# Patient Record
Sex: Female | Born: 1975 | Race: Black or African American | Hispanic: No | Marital: Single | Smoking: Former smoker
Health system: Southern US, Community
[De-identification: ages and names within clinical notes are randomized; demographics above are authoritative.]

## PROBLEM LIST (undated history)

## (undated) DIAGNOSIS — Z789 Other specified health status: Secondary | ICD-10-CM

---

## 2001-03-20 HISTORY — PX: TUBAL LIGATION: SHX77

## 2003-11-04 ENCOUNTER — Emergency Department (HOSPITAL_COMMUNITY): Admission: EM | Admit: 2003-11-04 | Discharge: 2003-11-04 | Payer: Self-pay | Admitting: Family Medicine

## 2004-01-26 ENCOUNTER — Emergency Department (HOSPITAL_COMMUNITY): Admission: EM | Admit: 2004-01-26 | Discharge: 2004-01-26 | Payer: Self-pay | Admitting: Family Medicine

## 2004-02-03 ENCOUNTER — Emergency Department (HOSPITAL_COMMUNITY): Admission: EM | Admit: 2004-02-03 | Discharge: 2004-02-03 | Payer: Self-pay | Admitting: Family Medicine

## 2004-03-25 ENCOUNTER — Emergency Department (HOSPITAL_COMMUNITY): Admission: EM | Admit: 2004-03-25 | Discharge: 2004-03-25 | Payer: Self-pay | Admitting: Family Medicine

## 2004-04-11 ENCOUNTER — Emergency Department (HOSPITAL_COMMUNITY): Admission: AD | Admit: 2004-04-11 | Discharge: 2004-04-11 | Payer: Self-pay | Admitting: Family Medicine

## 2004-07-11 ENCOUNTER — Emergency Department (HOSPITAL_COMMUNITY): Admission: EM | Admit: 2004-07-11 | Discharge: 2004-07-11 | Payer: Self-pay | Admitting: Family Medicine

## 2009-02-02 ENCOUNTER — Emergency Department (HOSPITAL_COMMUNITY): Admission: EM | Admit: 2009-02-02 | Discharge: 2009-02-02 | Payer: Self-pay | Admitting: Emergency Medicine

## 2009-03-12 ENCOUNTER — Emergency Department (HOSPITAL_COMMUNITY): Admission: EM | Admit: 2009-03-12 | Discharge: 2009-03-12 | Payer: Self-pay | Admitting: Family Medicine

## 2009-04-20 ENCOUNTER — Emergency Department (HOSPITAL_COMMUNITY): Admission: EM | Admit: 2009-04-20 | Discharge: 2009-04-20 | Payer: Self-pay | Admitting: Family Medicine

## 2009-07-02 ENCOUNTER — Inpatient Hospital Stay (HOSPITAL_COMMUNITY): Admission: AD | Admit: 2009-07-02 | Discharge: 2009-07-02 | Payer: Self-pay | Admitting: Obstetrics & Gynecology

## 2010-01-02 ENCOUNTER — Ambulatory Visit: Payer: Self-pay | Admitting: Nurse Practitioner

## 2010-01-02 ENCOUNTER — Inpatient Hospital Stay (HOSPITAL_COMMUNITY)
Admission: AD | Admit: 2010-01-02 | Discharge: 2010-01-02 | Payer: Self-pay | Source: Home / Self Care | Admitting: Obstetrics & Gynecology

## 2010-06-07 LAB — WET PREP, GENITAL
Trich, Wet Prep: NONE SEEN
Yeast Wet Prep HPF POC: NONE SEEN

## 2010-06-07 LAB — URINE MICROSCOPIC-ADD ON

## 2010-06-07 LAB — URINALYSIS, ROUTINE W REFLEX MICROSCOPIC
Bilirubin Urine: NEGATIVE
Glucose, UA: NEGATIVE mg/dL
Ketones, ur: NEGATIVE mg/dL
Nitrite: NEGATIVE
Protein, ur: NEGATIVE mg/dL
Specific Gravity, Urine: >1.03 — ABNORMAL HIGH (ref 1.005–1.030)
Urobilinogen, UA: 0.2 mg/dL (ref 0.0–1.0)
pH: 5.5 (ref 5.0–8.0)

## 2010-06-07 LAB — CBC
HCT: 36 % (ref 36.0–46.0)
Hemoglobin: 12 g/dL (ref 12.0–15.0)
MCHC: 33.3 g/dL (ref 30.0–36.0)
MCV: 82.5 fL (ref 78.0–100.0)
Platelets: 245 10*3/uL (ref 150–400)
RBC: 4.36 MIL/uL (ref 3.87–5.11)
RDW: 14.9 % (ref 11.5–15.5)
WBC: 5.4 10*3/uL (ref 4.0–10.5)

## 2010-06-07 LAB — GC/CHLAMYDIA PROBE AMP, GENITAL
Chlamydia, DNA Probe: NEGATIVE
GC Probe Amp, Genital: NEGATIVE

## 2010-06-07 LAB — POCT PREGNANCY, URINE: Preg Test, Ur: NEGATIVE

## 2010-06-08 LAB — GC/CHLAMYDIA PROBE AMP, GENITAL
Chlamydia, DNA Probe: NEGATIVE
GC Probe Amp, Genital: NEGATIVE

## 2010-06-08 LAB — POCT URINALYSIS DIP (DEVICE)
Bilirubin Urine: NEGATIVE
Glucose, UA: NEGATIVE mg/dL
Hgb urine dipstick: NEGATIVE
Nitrite: NEGATIVE
Protein, ur: 30 mg/dL — AB
Specific Gravity, Urine: 1.02 (ref 1.005–1.030)
Urobilinogen, UA: 2 mg/dL — ABNORMAL HIGH (ref 0.0–1.0)
pH: 6.5 (ref 5.0–8.0)

## 2010-06-08 LAB — WET PREP, GENITAL
Trich, Wet Prep: NONE SEEN
WBC, Wet Prep HPF POC: NONE SEEN
Yeast Wet Prep HPF POC: NONE SEEN

## 2010-06-08 LAB — POCT PREGNANCY, URINE: Preg Test, Ur: NEGATIVE

## 2010-06-22 LAB — DIFFERENTIAL
Basophils Absolute: 0 10*3/uL (ref 0.0–0.1)
Basophils Relative: 1 % (ref 0–1)
Eosinophils Absolute: 0 10*3/uL (ref 0.0–0.7)
Eosinophils Relative: 1 % (ref 0–5)
Lymphocytes Relative: 25 % (ref 12–46)
Lymphs Abs: 1.7 10*3/uL (ref 0.7–4.0)
Monocytes Absolute: 0.5 10*3/uL (ref 0.1–1.0)
Monocytes Relative: 8 % (ref 3–12)
Neutro Abs: 4.3 10*3/uL (ref 1.7–7.7)
Neutrophils Relative %: 65 % (ref 43–77)

## 2010-06-22 LAB — POCT I-STAT, CHEM 8
BUN: 7 mg/dL (ref 6–23)
Calcium, Ion: 1.17 mmol/L (ref 1.12–1.32)
Chloride: 104 mEq/L (ref 96–112)
Creatinine, Ser: 0.8 mg/dL (ref 0.4–1.2)
Glucose, Bld: 81 mg/dL (ref 70–99)
HCT: 45 % (ref 36.0–46.0)
Hemoglobin: 15.3 g/dL — ABNORMAL HIGH (ref 12.0–15.0)
Potassium: 4.1 mEq/L (ref 3.5–5.1)
Sodium: 139 mEq/L (ref 135–145)
TCO2: 25 mmol/L (ref 0–100)

## 2010-06-22 LAB — POCT URINALYSIS DIP (DEVICE)
Bilirubin Urine: NEGATIVE
Glucose, UA: NEGATIVE mg/dL
Hgb urine dipstick: NEGATIVE
Ketones, ur: NEGATIVE mg/dL
Nitrite: NEGATIVE
Protein, ur: NEGATIVE mg/dL
Specific Gravity, Urine: 1.01 (ref 1.005–1.030)
Urobilinogen, UA: 0.2 mg/dL (ref 0.0–1.0)
pH: 7 (ref 5.0–8.0)

## 2010-06-22 LAB — POCT PREGNANCY, URINE: Preg Test, Ur: NEGATIVE

## 2010-06-22 LAB — CBC
HCT: 40.4 % (ref 36.0–46.0)
Hemoglobin: 13.8 g/dL (ref 12.0–15.0)
MCHC: 34.2 g/dL (ref 30.0–36.0)
MCV: 82.3 fL (ref 78.0–100.0)
Platelets: 311 10*3/uL (ref 150–400)
RBC: 4.91 MIL/uL (ref 3.87–5.11)
RDW: 13.4 % (ref 11.5–15.5)
WBC: 6.6 10*3/uL (ref 4.0–10.5)

## 2010-07-01 ENCOUNTER — Inpatient Hospital Stay (INDEPENDENT_AMBULATORY_CARE_PROVIDER_SITE_OTHER)
Admission: RE | Admit: 2010-07-01 | Discharge: 2010-07-01 | Disposition: A | Discharge status: Home / Self Care | Payer: Self-pay | Source: Ambulatory Visit | Attending: Family Medicine | Admitting: Family Medicine

## 2010-07-01 DIAGNOSIS — H109 Unspecified conjunctivitis: Secondary | ICD-10-CM

## 2010-08-06 ENCOUNTER — Emergency Department (HOSPITAL_COMMUNITY): Payer: No Typology Code available for payment source

## 2010-08-06 ENCOUNTER — Emergency Department (HOSPITAL_COMMUNITY)
Admission: EM | Admit: 2010-08-06 | Discharge: 2010-08-06 | Disposition: A | Discharge status: Home / Self Care | Payer: No Typology Code available for payment source | Attending: Emergency Medicine | Admitting: Emergency Medicine

## 2010-08-06 DIAGNOSIS — Y929 Unspecified place or not applicable: Secondary | ICD-10-CM | POA: Insufficient documentation

## 2010-08-06 DIAGNOSIS — R51 Headache: Secondary | ICD-10-CM | POA: Insufficient documentation

## 2010-08-06 DIAGNOSIS — M542 Cervicalgia: Secondary | ICD-10-CM | POA: Insufficient documentation

## 2010-08-06 LAB — POCT PREGNANCY, URINE: Preg Test, Ur: NEGATIVE

## 2010-08-09 ENCOUNTER — Emergency Department (HOSPITAL_COMMUNITY)
Admission: EM | Admit: 2010-08-09 | Discharge: 2010-08-09 | Disposition: A | Discharge status: Home / Self Care | Payer: No Typology Code available for payment source | Attending: Emergency Medicine | Admitting: Emergency Medicine

## 2010-08-09 DIAGNOSIS — S199XXA Unspecified injury of neck, initial encounter: Secondary | ICD-10-CM | POA: Insufficient documentation

## 2010-08-09 DIAGNOSIS — M545 Low back pain, unspecified: Secondary | ICD-10-CM | POA: Insufficient documentation

## 2010-08-09 DIAGNOSIS — M542 Cervicalgia: Secondary | ICD-10-CM | POA: Insufficient documentation

## 2010-08-09 DIAGNOSIS — S0993XA Unspecified injury of face, initial encounter: Secondary | ICD-10-CM | POA: Insufficient documentation

## 2011-01-27 ENCOUNTER — Inpatient Hospital Stay (HOSPITAL_COMMUNITY)
Admission: AD | Admit: 2011-01-27 | Discharge: 2011-01-27 | Disposition: A | Discharge status: Home / Self Care | Payer: Self-pay | Source: Ambulatory Visit | Attending: Obstetrics & Gynecology | Admitting: Obstetrics & Gynecology

## 2011-01-27 ENCOUNTER — Encounter (HOSPITAL_COMMUNITY): Payer: Self-pay

## 2011-01-27 DIAGNOSIS — R109 Unspecified abdominal pain: Secondary | ICD-10-CM

## 2011-01-27 HISTORY — DX: Other specified health status: Z78.9

## 2011-01-27 LAB — URINALYSIS, ROUTINE W REFLEX MICROSCOPIC
Bilirubin Urine: NEGATIVE
Glucose, UA: NEGATIVE mg/dL
Ketones, ur: NEGATIVE mg/dL
Leukocytes, UA: NEGATIVE
Nitrite: NEGATIVE
Protein, ur: NEGATIVE mg/dL
Specific Gravity, Urine: >1.03 — ABNORMAL HIGH (ref 1.005–1.030)
Urobilinogen, UA: 0.2 mg/dL (ref 0.0–1.0)
pH: 6 (ref 5.0–8.0)

## 2011-01-27 LAB — DIFFERENTIAL
Basophils Absolute: 0.1 10*3/uL (ref 0.0–0.1)
Basophils Relative: 1 % (ref 0–1)
Eosinophils Absolute: 0.1 10*3/uL (ref 0.0–0.7)
Neutrophils Relative %: 55 % (ref 43–77)

## 2011-01-27 LAB — CBC
MCH: 26.2 pg (ref 26.0–34.0)
MCHC: 32.7 g/dL (ref 30.0–36.0)
Platelets: 283 10*3/uL (ref 150–400)

## 2011-01-27 LAB — WET PREP, GENITAL
Trich, Wet Prep: NONE SEEN
Yeast Wet Prep HPF POC: NONE SEEN

## 2011-01-27 LAB — URINE MICROSCOPIC-ADD ON

## 2011-01-27 LAB — PREGNANCY, URINE: Preg Test, Ur: NEGATIVE

## 2011-01-27 LAB — POCT PREGNANCY, URINE: Preg Test, Ur: NEGATIVE

## 2011-01-27 NOTE — ED Provider Notes (Signed)
History     Chief Complaint  Patient presents with  . Abdominal Pain   HPIPhenece T [id number] y.o. presents with bilateral mid abdominal pain, began 3 days ago, worse today.  Nausea without vomiting.  Last Bowel Movement today-normal.  LMP end of Oct-normal.  TUBAL Ligation for contraception and condoms.  1 sexual partner.  G5 P4 014.  Does report burping with odor.    Past Medical History  Diagnosis Date  . No pertinent past medical history     Past Surgical History  Procedure Date  . Tubal ligation 2003    No family history on file.  History  Substance Use Topics  . Smoking status: Never Smoker   . Smokeless tobacco: Not on file  . Alcohol Use: Yes     occassional    Allergies: No Known Allergies  Prescriptions prior to admission  Medication Sig Dispense Refill  . acetaminophen (TYLENOL) 500 MG tablet Take 1,000 mg by mouth daily as needed. For pain         Review of Systems  Constitutional: Negative.   Respiratory: Negative.   Gastrointestinal: Positive for abdominal pain. Negative for vomiting, diarrhea and constipation.  Genitourinary: Negative.        Negative for vaginal bleeding.  + for white thick discharge.   Physical Exam   Blood pressure 113/71, pulse 89, temperature 98.7 F (37.1 C), temperature source Oral, resp. rate 16, height 5\' 5"  (1.651 m), weight 146 lb (66.225 kg), last menstrual period 01/14/2011.  Physical Exam  Constitutional: She is oriented to person, place, and time. She appears well-developed and well-nourished. No distress.  Neck: Normal range of motion.  Cardiovascular: Normal rate.   Respiratory: Effort normal.  GI: She exhibits no distension (bilateral mid abdominal pain.) and no mass. There is tenderness. There is no rebound and no guarding.  Genitourinary: Uterus is not tender. Cervix exhibits no motion tenderness, no discharge and no friability. Right adnexum displays no mass, no tenderness and no fullness. Left adnexum  displays no mass, no tenderness and no fullness. There is bleeding around the vagina. Vaginal discharge (white discharge without odor) found.  Neurological: She is alert and oriented to person, place, and time.  Skin: Skin is warm and dry.   Results for orders placed during the hospital encounter of 01/27/11 (from the past 24 hour(s))  PREGNANCY, URINE     Status: Normal   Collection Time   01/27/11  1:26 PM      Component Value Range   Preg Test, Ur NEGATIVE    URINALYSIS, ROUTINE W REFLEX MICROSCOPIC     Status: Abnormal   Collection Time   01/27/11  1:26 PM      Component Value Range   Color, Urine YELLOW  YELLOW    Appearance CLEAR  CLEAR    Specific Gravity, Urine >1.030 (*) 1.005 - 1.030    pH 6.0  5.0 - 8.0    Glucose, UA NEGATIVE  NEGATIVE (mg/dL)   Hgb urine dipstick SMALL (*) NEGATIVE    Bilirubin Urine NEGATIVE  NEGATIVE    Ketones, ur NEGATIVE  NEGATIVE (mg/dL)   Protein, ur NEGATIVE  NEGATIVE (mg/dL)   Urobilinogen, UA 0.2  0.0 - 1.0 (mg/dL)   Nitrite NEGATIVE  NEGATIVE    Leukocytes, UA NEGATIVE  NEGATIVE   URINE MICROSCOPIC-ADD ON     Status: Abnormal   Collection Time   01/27/11  1:26 PM      Component Value Range   Squamous Epithelial /  LPF FEW (*) RARE    RBC / HPF 3-6  <3 (RBC/hpf)   Urine-Other MUCOUS PRESENT    POCT PREGNANCY, URINE     Status: Normal   Collection Time   01/27/11  1:33 PM      Component Value Range   Preg Test, Ur NEGATIVE    CBC     Status: Normal   Collection Time   01/27/11  2:22 PM      Component Value Range   WBC 7.8  4.0 - 10.5 (K/uL)   RBC 4.81  3.87 - 5.11 (MIL/uL)   Hemoglobin 12.6  12.0 - 15.0 (g/dL)   HCT 16.1  09.6 - 04.5 (%)   MCV 80.0  78.0 - 100.0 (fL)   MCH 26.2  26.0 - 34.0 (pg)   MCHC 32.7  30.0 - 36.0 (g/dL)   RDW 40.9  81.1 - 91.4 (%)   Platelets 283  150 - 400 (K/uL)  DIFFERENTIAL     Status: Normal   Collection Time   01/27/11  2:22 PM      Component Value Range   Neutrophils Relative 55  43 - 77 (%)    Neutro Abs 4.3  1.7 - 7.7 (K/uL)   Lymphocytes Relative 34  12 - 46 (%)   Lymphs Abs 2.7  0.7 - 4.0 (K/uL)   Monocytes Relative 9  3 - 12 (%)   Monocytes Absolute 0.7  0.1 - 1.0 (K/uL)   Eosinophils Relative 1  0 - 5 (%)   Eosinophils Absolute 0.1  0.0 - 0.7 (K/uL)   Basophils Relative 1  0 - 1 (%)   Basophils Absolute 0.1  0.0 - 0.1 (K/uL)  WET PREP, GENITAL     Status: Abnormal   Collection Time   01/27/11  2:30 PM      Component Value Range   Yeast, Wet Prep NONE SEEN  NONE SEEN    Trich, Wet Prep NONE SEEN  NONE SEEN    Clue Cells, Wet Prep NONE SEEN  NONE SEEN    WBC, Wet Prep HPF POC FEW (*) NONE SEEN    MAU Course  Procedures  Gc/Chl culture to lab  MDM I have reviewed the physical findings, lab results with the patient.  I have offered her further evaluation of her pain at [facility] vs trying OTC anti-acids.  She does agree the pain seems to be more upper abdomen.  She prefers to try the OTC Prilosec or Prevacid and if not improvement will seek care for re-evaluation.  She is comfortable at the time of discharge.  Assessment and Plan  A: Abdominal Pain       P: Patient will try OTC antacids and if sxs continue will seek care for re-evaluation.    Lawren Sexson,EVE M 01/27/2011, 2:11 PM   Matt Holmes, NP 01/27/11 1531  Matt Holmes, NP 01/27/11 1531

## 2011-01-27 NOTE — Progress Notes (Signed)
Pt states LMP-01/14/2011, has generalized abd. Pain, white vaginal d/c, thick. Has had abd pain x3-4 days. Has h/a x1 day, nausea/low back pain x3-4 days as well.

## 2011-01-27 NOTE — ED Provider Notes (Signed)
Attestation of Attending Supervision of Advanced Practitioner: Evaluation and management procedures were performed by the PA/NP/CNM/OB Fellow under my supervision/collaboration. Chart reviewed, and agree with management and plan.  Jaynie Collins, M.D. 01/27/2011 3:46 PM

## 2012-07-17 ENCOUNTER — Emergency Department (HOSPITAL_COMMUNITY)
Admission: EM | Admit: 2012-07-17 | Discharge: 2012-07-17 | Disposition: A | Discharge status: Home / Self Care | Payer: Self-pay | Attending: Emergency Medicine | Admitting: Emergency Medicine

## 2012-07-17 ENCOUNTER — Encounter (HOSPITAL_COMMUNITY): Payer: Self-pay | Admitting: *Deleted

## 2012-07-17 DIAGNOSIS — R05 Cough: Secondary | ICD-10-CM | POA: Insufficient documentation

## 2012-07-17 DIAGNOSIS — R059 Cough, unspecified: Secondary | ICD-10-CM | POA: Insufficient documentation

## 2012-07-17 DIAGNOSIS — R0982 Postnasal drip: Secondary | ICD-10-CM | POA: Insufficient documentation

## 2012-07-17 DIAGNOSIS — M542 Cervicalgia: Secondary | ICD-10-CM | POA: Insufficient documentation

## 2012-07-17 DIAGNOSIS — M62838 Other muscle spasm: Secondary | ICD-10-CM | POA: Insufficient documentation

## 2012-07-17 DIAGNOSIS — J029 Acute pharyngitis, unspecified: Secondary | ICD-10-CM | POA: Insufficient documentation

## 2012-07-17 LAB — RAPID STREP SCREEN (MED CTR MEBANE ONLY): Streptococcus, Group A Screen (Direct): NEGATIVE

## 2012-07-17 MED ORDER — KETOROLAC TROMETHAMINE 60 MG/2ML IM SOLN
60.0000 mg | Freq: Once | INTRAMUSCULAR | Status: AC
  Administered 2012-07-17: 60 mg via INTRAMUSCULAR
  Filled 2012-07-17: qty 2

## 2012-07-17 MED ORDER — IBUPROFEN 800 MG PO TABS: 800.0000 mg | ORAL_TABLET | Freq: Three times a day (TID) | ORAL | Status: DC

## 2012-07-17 MED ORDER — TRAMADOL HCL 50 MG PO TABS
50.0000 mg | ORAL_TABLET | Freq: Once | ORAL | Status: AC
  Administered 2012-07-17: 50 mg via ORAL
  Filled 2012-07-17: qty 1

## 2012-07-17 NOTE — ED Provider Notes (Signed)
History     CSN: [id number]  Arrival date & time 07/17/12  0304   First MD Initiated Contact with Patient 07/17/12 [phone]      Chief Complaint  Patient presents with  . Spasms    (Consider location/radiation/quality/duration/timing/severity/associated sxs/prior treatment) The history is provided by the patient. No language interpreter was used.   Pt is a 37yo female c/o left side neck pain and muscle spasm that started around 11am yesterday.  Pt states it also hurts to swallow.  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  Pain is 12/10 on pain scale, worse with neck rotation.  Has tried 1g of Tylenol PTA w/o relief.  Denies previous episodes of similar pain.  Reports a mild dry cough but otherwise feels well.  Denies fever, n/v/d, or difficulty breathing.  Denies significant medical hx.  Is not on any daily medication besides vitamins.   Past Medical History  Diagnosis Date  . No pertinent past medical history     Past Surgical History  Procedure Laterality Date  . Tubal ligation  2003    No family history on file.  History  Substance Use Topics  . Smoking status: Never Smoker   . Smokeless tobacco: Not on file  . Alcohol Use: Yes     Comment: occassional    OB History   Grav Para Term Preterm Abortions TAB SAB Ect Mult Living   5 4 4  0 1 0 1 0 0 4      Review of Systems  Constitutional: Negative for fever and chills.  HENT: Positive for sore throat.   Respiratory: Positive for cough.   Gastrointestinal: Negative for nausea and vomiting.  All other systems reviewed and are negative.    Allergies  Review of patient's allergies indicates no known allergies.  Home Medications   Current Outpatient Rx  Name  Route  Sig  Dispense  Refill  . acetaminophen (TYLENOL) 500 MG tablet   Oral   Take 1,000 mg by mouth daily as needed. For pain          . ibuprofen (ADVIL,MOTRIN) 800 MG tablet   Oral   Take 1 tablet (800 mg  total) by mouth 3 (three) times daily.   21 tablet   0     BP 106/77  Pulse 97  Temp(Src) 99 F (37.2 C)  Resp 20  Wt 144 lb (65.318 kg)  BMI 23.96 kg/m2  SpO2 97%  LMP 06/26/2012  Physical Exam  Nursing note and vitals reviewed. Constitutional: She appears well-developed and well-nourished. No distress.  HENT:  Head: Normocephalic and atraumatic.  Mouth/Throat: Oropharynx is clear and moist. No oropharyngeal exudate.  Oropharynx: Mallampati III with post nasal drip.  Mild erythema, no tonsillar hypertrophy or exudates.   Eyes: Conjunctivae are normal. No scleral icterus.  Neck: Normal range of motion. Neck supple. No JVD present. No tracheal deviation present. No thyromegaly present.  FROM, no nuchal rigidity   Cardiovascular: Normal rate, regular rhythm and normal heart sounds.   Pulmonary/Chest: Effort normal and breath sounds normal. No stridor. No respiratory distress. She has no wheezes.  Musculoskeletal: Normal range of motion. She exhibits tenderness ( ttp upper trapezius muslce L>R. ).  Palpable muscle spasm over left scapula.   Lymphadenopathy:    She has no cervical adenopathy.  Neurological: She is alert.  Skin: Skin is warm and dry. She is not diaphoretic.  Psychiatric: She has a normal mood  and affect. Her behavior is normal.    ED Course  Procedures (including critical care time)  Labs Reviewed  RAPID STREP SCREEN   No results found.   1. Neck muscle spasm   2. Sore throat       MDM  Pt c/o neck spasm since 11am yesterday.  Reports painful to swallow as well, associated with mild dry cough.  Otherwise feels well.  Pt given Toradol in ED with some relief, 12/10 to 8/10.  Pt also started on fluids.  Will try tramadol.  PE: muscle spasm felt on left upper trapezius muscle, pain worsened with head rotation.  Oropharynx: Mallampati III, mild erythema and post-nasal drip. No tonsillar hypertrophy or lesions visualized on exam but ordered rapid strep due  to limited visualization of oropharynx and c/o sore throat.  Neck is supple without nuchale rigidity.  Do not believe this is meningitis.   Rapid strep: neg.    8:20 AM Pt states she is feeling much better and would like to go home.  Will discharge pt home and have her f/u with PCP as needed for neck spasm.  Return to ED if unable to swallow or experience difficulty breathing.   Neck pain likely due to muscle spasm.  Sore throat possible viral pharyngitis.  Will discharge pt home and tx with NSAIDs.  Rx: ibuprofen  F/u with PCP as needed.  Return to ED if unable to swallow of difficulty breathing.   Vitals: unremarkable. Discharged in stable condition.    Discussed pt with attending during ED encounter.       Junius Finner, PA-C 07/17/12 [phone]

## 2012-07-17 NOTE — ED Provider Notes (Signed)
Medical screening examination/treatment/procedure(s) were conducted as a shared visit with non-physician practitioner(s) and myself.  I personally evaluated the patient during the encounter  Mild muscle spasms of left neck muscles. Pain on range of motion without limitation.  Nursing notes and vitals signs, including pulse oximetry, reviewed.  Summary of this visit's results, reviewed by myself:  Labs:  Results for orders placed during the hospital encounter of 07/17/12 (from the past 24 hour(s))  RAPID STREP SCREEN     Status: None   Collection Time    07/17/12  6:57 AM      Result Value Range   Streptococcus, Group A Screen (Direct) NEGATIVE  NEGATIVE    Carlisle Beers Danyelle Brookover, MD 07/17/12 0730

## 2012-07-17 NOTE — ED Notes (Signed)
Pt c/o muscle spasms in neck since about 11am; states having pain in back and neck; hurts to swallow

## 2012-12-12 ENCOUNTER — Encounter (HOSPITAL_COMMUNITY): Payer: Self-pay | Admitting: Emergency Medicine

## 2012-12-12 ENCOUNTER — Emergency Department (HOSPITAL_COMMUNITY)
Admission: EM | Admit: 2012-12-12 | Discharge: 2012-12-12 | Disposition: A | Discharge status: Home / Self Care | Payer: 59 | Source: Home / Self Care | Attending: Family Medicine | Admitting: Family Medicine

## 2012-12-12 ENCOUNTER — Emergency Department (INDEPENDENT_AMBULATORY_CARE_PROVIDER_SITE_OTHER): Payer: 59

## 2012-12-12 DIAGNOSIS — J069 Acute upper respiratory infection, unspecified: Secondary | ICD-10-CM

## 2012-12-12 DIAGNOSIS — R0789 Other chest pain: Secondary | ICD-10-CM

## 2012-12-12 DIAGNOSIS — N390 Urinary tract infection, site not specified: Secondary | ICD-10-CM

## 2012-12-12 LAB — CBC WITH DIFFERENTIAL/PLATELET
Eosinophils Relative: 3 % (ref 0–5)
HCT: 36.7 % (ref 36.0–46.0)
Lymphocytes Relative: 31 % (ref 12–46)
Lymphs Abs: 1.8 10*3/uL (ref 0.7–4.0)
MCV: 79.4 fL (ref 78.0–100.0)
Monocytes Absolute: 0.5 10*3/uL (ref 0.1–1.0)
Platelets: 328 10*3/uL (ref 150–400)
RBC: 4.62 MIL/uL (ref 3.87–5.11)
WBC: 5.9 10*3/uL (ref 4.0–10.5)

## 2012-12-12 LAB — POCT URINALYSIS DIP (DEVICE)
Glucose, UA: NEGATIVE mg/dL
Nitrite: POSITIVE — AB
Protein, ur: NEGATIVE mg/dL
Urobilinogen, UA: 0.2 mg/dL (ref 0.0–1.0)
pH: 6.5 (ref 5.0–8.0)

## 2012-12-12 LAB — BASIC METABOLIC PANEL
CO2: 25 mEq/L (ref 19–32)
Calcium: 9.1 mg/dL (ref 8.4–10.5)
Glucose, Bld: 84 mg/dL (ref 70–99)
Sodium: 137 mEq/L (ref 135–145)

## 2012-12-12 MED ORDER — CEPHALEXIN 500 MG PO CAPS: 1000.0000 mg | ORAL_CAPSULE | Freq: Two times a day (BID) | ORAL | Status: DC

## 2012-12-12 NOTE — ED Provider Notes (Signed)
CSN: [id number]     Arrival date & time 12/12/12  1191 History   First MD Initiated Contact with Patient 12/12/12 [phone]     Chief Complaint  Patient presents with  . Cough   (Consider location/radiation/quality/duration/timing/severity/associated sxs/prior Treatment) HPI Comments: 37 year old female presents complaining of cough, chest pain, and headache for 3 days and foul-smelling urine for one week. She says the worst thing right now is the chest pain. She describes this as a constant dull aching pain substernally. The pain becomes burning in quality after she coughs. The pain is nonradiating. She has never experienced this before has no history of heart problems. She does admit to nausea over the past 3 days as well. The cough is productive or sputum and sometimes blood-tinged sputum. The headache is in a bandlike distribution across her forehead. She also admits to mild right flank pain. She denies fever, chills, myalgias/arthralgias. Denies vomiting, dysuria, hematuria  Patient is a 37 y.o. female presenting with cough.  Cough Associated symptoms: chest pain and headaches   Associated symptoms: no chills, no fever, no myalgias, no rash and no shortness of breath     Past Medical History  Diagnosis Date  . No pertinent past medical history    Past Surgical History  Procedure Laterality Date  . Tubal ligation  2003   No family history on file. History  Substance Use Topics  . Smoking status: Current Every Day Smoker  . Smokeless tobacco: Not on file  . Alcohol Use: Yes     Comment: occassional   OB History   Grav Para Term Preterm Abortions TAB SAB Ect Mult Living   5 4 4  0 1 0 1 0 0 4     Review of Systems  Constitutional: Positive for fatigue. Negative for fever and chills.  Eyes: Negative for visual disturbance.  Respiratory: Positive for cough. Negative for shortness of breath.   Cardiovascular: Positive for chest pain. Negative for palpitations and leg swelling.   Gastrointestinal: Positive for nausea. Negative for vomiting and abdominal pain.  Endocrine: Negative for polydipsia and polyuria.  Genitourinary: Positive for flank pain. Negative for dysuria, urgency and frequency.  Musculoskeletal: Negative for myalgias and arthralgias.  Skin: Negative for rash.  Neurological: Positive for headaches. Negative for dizziness, weakness and light-headedness.    Allergies  Review of patient's allergies indicates no known allergies.  Home Medications   Current Outpatient Rx  Name  Route  Sig  Dispense  Refill  . acetaminophen (TYLENOL) 500 MG tablet   Oral   Take 1,000 mg by mouth daily as needed. For pain          . cephALEXin (KEFLEX) 500 MG capsule   Oral   Take 2 capsules (1,000 mg total) by mouth 2 (two) times daily.   20 capsule   0   . ibuprofen (ADVIL,MOTRIN) 800 MG tablet   Oral   Take 1 tablet (800 mg total) by mouth 3 (three) times daily.   21 tablet   0    BP 123/85  Pulse 76  Temp(Src) 98.6 F (37 C) (Oral)  Resp 18  SpO2 100%  LMP 12/07/2012 Physical Exam  Nursing note and vitals reviewed. Constitutional: She is oriented to person, place, and time. Vital signs are normal. She appears well-developed and well-nourished. No distress.  HENT:  Head: Normocephalic and atraumatic.  Eyes: Conjunctivae and EOM are normal. Pupils are equal, round, and reactive to light.  Cardiovascular: Normal rate, regular rhythm and normal heart  sounds.   Pulmonary/Chest: Effort normal. No respiratory distress. She has wheezes in the left upper field. She has rhonchi in the left upper field. She has no rales. She exhibits no tenderness.  Abdominal: Soft.  Neurological: She is alert and oriented to person, place, and time. She has normal strength. Coordination normal.  Skin: Skin is warm and dry. No rash noted. She is not diaphoretic.  Psychiatric: She has a normal mood and affect. Judgment normal.    ED Course  Procedures (including  critical care time) Labs Review Labs Reviewed  CBC WITH DIFFERENTIAL  BASIC METABOLIC PANEL  D-DIMER, QUANTITATIVE  POCT PREGNANCY, URINE   Imaging Review Dg Chest 2 View  12/12/2012   CLINICAL DATA:  Cough  EXAM: CHEST  2 VIEW  COMPARISON:  None.  FINDINGS: There is no focal infiltrate, pulmonary edema, or pleural effusion. The mediastinal contour and cardiac silhouette are normal. The soft tissues and osseous structures are normal.  IMPRESSION: No active cardiopulmonary disease.   Electronically Signed   By: Sherian Rein   On: 12/12/2012 09:53   EKG: NSR  MDM   1. URI (upper respiratory infection)   2. Chest pain, atypical   3. UTI (lower urinary tract infection)    EKG, chest x-ray are both normal. Urinalysis shows possible urinary tract infection. Treated for the UTI, also symptomatically for URI. Sending labs: CBC, BMP, d-dimer. We'll call with results. Return precautions given regarding the chest pain. The patient has no risk factors and no previous history of cardiac disease. This pain is atypical, low suspicion for ACS   Meds ordered this encounter  Medications  . cephALEXin (KEFLEX) 500 MG capsule    Sig: Take 2 capsules (1,000 mg total) by mouth 2 (two) times daily.    Dispense:  20 capsule    Refill:  0      Graylon Good, PA-C 12/12/12 1016

## 2012-12-12 NOTE — ED Notes (Signed)
Multiple complaints: c/o chest pain described as burning sensation.  Cough, headache, congested sinus, congested chest, yellow phlegm with occasionally blood tinge.  Patient also reports odor to urine and right side pain

## 2012-12-13 NOTE — ED Provider Notes (Signed)
Medical screening examination/treatment/procedure(s) were performed by a resident physician or non-physician practitioner and as the supervising physician I was immediately available for consultation/collaboration.  Clementeen Graham, MD   Rodolph Bong, MD 12/13/12 [phone]

## 2012-12-14 LAB — URINE CULTURE

## 2012-12-16 NOTE — ED Notes (Signed)
Urine culture: >100,000 colonies E. Coli.  Pt. adequately treated with Keflex. Jennifer Roth 12/16/2012

## 2013-01-10 ENCOUNTER — Encounter (HOSPITAL_COMMUNITY): Payer: Self-pay | Admitting: Emergency Medicine

## 2013-01-10 ENCOUNTER — Emergency Department (HOSPITAL_COMMUNITY)
Admission: EM | Admit: 2013-01-10 | Discharge: 2013-01-10 | Disposition: A | Discharge status: Home / Self Care | Payer: 59 | Source: Home / Self Care | Attending: Family Medicine | Admitting: Family Medicine

## 2013-01-10 DIAGNOSIS — S161XXA Strain of muscle, fascia and tendon at neck level, initial encounter: Secondary | ICD-10-CM

## 2013-01-10 DIAGNOSIS — S139XXA Sprain of joints and ligaments of unspecified parts of neck, initial encounter: Secondary | ICD-10-CM

## 2013-01-10 MED ORDER — METHOCARBAMOL 500 MG PO TABS: 500.0000 mg | ORAL_TABLET | Freq: Three times a day (TID) | ORAL | Status: DC

## 2013-01-10 MED ORDER — TRAMADOL HCL 50 MG PO TABS: 50.0000 mg | ORAL_TABLET | Freq: Four times a day (QID) | ORAL | Status: DC | PRN
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
## 2013-01-12 NOTE — ED Provider Notes (Signed)
Medical screening examination/treatment/procedure(s) were performed by a resident physician or non-physician practitioner and as the supervising physician I was immediately available for consultation/collaboration.  Clementeen Graham, MD  Rodolph Bong, MD 01/12/13 [phone]

## 2013-06-09 ENCOUNTER — Inpatient Hospital Stay (HOSPITAL_COMMUNITY)
Admission: AD | Admit: 2013-06-09 | Discharge: 2013-06-09 | Disposition: A | Discharge status: Home / Self Care | Payer: Medicaid Other | Source: Ambulatory Visit | Attending: Obstetrics and Gynecology | Admitting: Obstetrics and Gynecology

## 2013-06-09 ENCOUNTER — Encounter (HOSPITAL_COMMUNITY): Payer: Self-pay | Admitting: *Deleted

## 2013-06-09 DIAGNOSIS — F17200 Nicotine dependence, unspecified, uncomplicated: Secondary | ICD-10-CM | POA: Insufficient documentation

## 2013-06-09 DIAGNOSIS — N390 Urinary tract infection, site not specified: Secondary | ICD-10-CM

## 2013-06-09 HISTORY — DX: Other specified health status: Z78.9

## 2013-06-09 LAB — URINE MICROSCOPIC-ADD ON

## 2013-06-09 LAB — POCT PREGNANCY, URINE: PREG TEST UR: NEGATIVE

## 2013-06-09 LAB — URINALYSIS, ROUTINE W REFLEX MICROSCOPIC
Bilirubin Urine: NEGATIVE
GLUCOSE, UA: NEGATIVE mg/dL
Ketones, ur: NEGATIVE mg/dL
NITRITE: POSITIVE — AB
PH: 6 (ref 5.0–8.0)
Protein, ur: >300 mg/dL — AB
SPECIFIC GRAVITY, URINE: 1.025 (ref 1.005–1.030)
Urobilinogen, UA: 0.2 mg/dL (ref 0.0–1.0)

## 2013-06-09 LAB — WET PREP, GENITAL
TRICH WET PREP: NONE SEEN
YEAST WET PREP: NONE SEEN

## 2013-06-09 MED ORDER — PHENAZOPYRIDINE HCL 100 MG PO TABS
200.0000 mg | ORAL_TABLET | Freq: Once | ORAL | Status: AC
  Administered 2013-06-09: 200 mg via ORAL
  Filled 2013-06-09: qty 2

## 2013-06-09 MED ORDER — PHENAZOPYRIDINE HCL 200 MG PO TABS: 200.0000 mg | ORAL_TABLET | Freq: Three times a day (TID) | ORAL | Status: DC | PRN

## 2013-06-09 MED ORDER — KETOROLAC TROMETHAMINE 60 MG/2ML IM SOLN
60.0000 mg | Freq: Once | INTRAMUSCULAR | Status: AC
  Administered 2013-06-09: 60 mg via INTRAMUSCULAR
  Filled 2013-06-09: qty 2

## 2013-06-09 MED ORDER — CIPROFLOXACIN HCL 500 MG PO TABS
500.0000 mg | ORAL_TABLET | Freq: Once | ORAL | Status: AC
  Administered 2013-06-09: 500 mg via ORAL
  Filled 2013-06-09: qty 1

## 2013-06-09 MED ORDER — CIPROFLOXACIN HCL 500 MG PO TABS: 500.0000 mg | ORAL_TABLET | Freq: Two times a day (BID) | ORAL | Status: DC

## 2013-06-09 NOTE — MAU Provider Note (Signed)
`````  Attestation of Attending Supervision of Advanced Practitioner: Evaluation and management procedures were performed by the PA/NP/CNM/OB Fellow under my supervision/collaboration. Chart reviewed and agree with management and plan.  Alfio Loescher V 06/09/2013 11:47 PM

## 2013-06-09 NOTE — Discharge Instructions (Signed)
Urinary Tract Infection  Urinary tract infections (UTIs) can develop anywhere along your urinary tract. Your urinary tract is your body's drainage system for removing wastes and extra water. Your urinary tract includes two kidneys, two ureters, a bladder, and a urethra. Your kidneys are a pair of bean-shaped organs. Each kidney is about the size of your fist. They are located below your ribs, one on each side of your spine.  CAUSES  Infections are caused by microbes, which are microscopic organisms, including fungi, viruses, and bacteria. These organisms are so small that they can only be seen through a microscope. Bacteria are the microbes that most commonly cause UTIs.  SYMPTOMS   Symptoms of UTIs may vary by age and gender of the patient and by the location of the infection. Symptoms in young women typically include a frequent and intense urge to urinate and a painful, burning feeling in the bladder or urethra during urination. Older women and men are more likely to be tired, shaky, and weak and have muscle aches and abdominal pain. A fever may mean the infection is in your kidneys. Other symptoms of a kidney infection include pain in your back or sides below the ribs, nausea, and vomiting.  DIAGNOSIS  To diagnose a UTI, your caregiver will ask you about your symptoms. Your caregiver also will ask to provide a urine sample. The urine sample will be tested for bacteria and white blood cells. White blood cells are made by your body to help fight infection.  TREATMENT   Typically, UTIs can be treated with medication. Because most UTIs are caused by a bacterial infection, they usually can be treated with the use of antibiotics. The choice of antibiotic and length of treatment depend on your symptoms and the type of bacteria causing your infection.  HOME CARE INSTRUCTIONS   If you were prescribed antibiotics, take them exactly as your caregiver instructs you. Finish the medication even if you feel better after you  have only taken some of the medication.   Drink enough water and fluids to keep your urine clear or pale yellow.   Avoid caffeine, tea, and carbonated beverages. They tend to irritate your bladder.   Empty your bladder often. Avoid holding urine for long periods of time.   Empty your bladder before and after sexual intercourse.   After a bowel movement, women should cleanse from front to back. Use each tissue only once.  SEEK MEDICAL CARE IF:    You have back pain.   You develop a fever.   Your symptoms do not begin to resolve within 3 days.  SEEK IMMEDIATE MEDICAL CARE IF:    You have severe back pain or lower abdominal pain.   You develop chills.   You have nausea or vomiting.   You have continued burning or discomfort with urination.  MAKE SURE YOU:    Understand these instructions.   Will watch your condition.   Will get help right away if you are not doing well or get worse.  Document Released: 12/14/2004 Document Revised: 09/05/2011 Document Reviewed: 04/14/2011  [employer] Patient Information 2014 [employer].

## 2013-06-09 NOTE — MAU Provider Note (Signed)
History     CSN: [id number]  Arrival date and time: 06/09/13 [id number]   None     Chief Complaint  Patient presents with  . Abdominal Pain  . Back Pain   Abdominal Pain  Back Pain Associated symptoms include abdominal pain.    Jennifer Roth is a 38 y.o. [id number] who presents today with "all over" abdominal pain. She states that she awoke with the pain around 0330 today. She denies any vaginal bleeding and states her LMP was the beginning of this month. She denies any fever, nausea or vomiting, and does reports some pain with urination and vaginal pain. She denies any vaginal discharge. She has not taken anything for the pain.   Past Medical History  Diagnosis Date  . No pertinent past medical history     Past Surgical History  Procedure Laterality Date  . Tubal ligation  2003    No family history on file.  History  Substance Use Topics  . Smoking status: Current Every Day Smoker  . Smokeless tobacco: Not on file  . Alcohol Use: Yes     Comment: occassional    Allergies: No Known Allergies  Prescriptions prior to admission  Medication Sig Dispense Refill  . acetaminophen (TYLENOL) 500 MG tablet Take 1,000 mg by mouth daily as needed. For pain       . ibuprofen (ADVIL,MOTRIN) 800 MG tablet Take 1 tablet (800 mg total) by mouth 3 (three) times daily.  21 tablet  0  . methocarbamol (ROBAXIN) 500 MG tablet Take 1 tablet (500 mg total) by mouth 3 (three) times daily.  10 tablet  0  . traMADol (ULTRAM) 50 MG tablet Take 1 tablet (50 mg total) by mouth every 6 (six) hours as needed for pain.  10 tablet  0    Review of Systems  Gastrointestinal: Positive for abdominal pain.  Musculoskeletal: Positive for back pain.   Physical Exam   There were no vitals taken for this visit.  Physical Exam  Nursing note and vitals reviewed. Constitutional: She is oriented to person, place, and time. She appears well-developed and well-nourished. She appears distressed (crying).   Cardiovascular: Normal rate.   Respiratory: Effort normal.  GI: Soft. There is no tenderness. There is no rebound.  Genitourinary:   External: no lesion Vagina: small amount of white discharge Cervix: pink, smooth, no CMT Uterus: NSSC Adnexa: NT   Neurological: She is alert and oriented to person, place, and time.  Skin: Skin is warm and dry.  Psychiatric: She has a normal mood and affect.    MAU Course  Procedures  Results for orders placed during the hospital encounter of 06/09/13 (from the past 24 hour(s))  URINALYSIS, ROUTINE W REFLEX MICROSCOPIC     Status: Abnormal   Collection Time    06/09/13  5:30 AM      Result Value Ref Range   Color, Urine YELLOW  YELLOW   APPearance HAZY (*) CLEAR   Specific Gravity, Urine 1.025  1.005 - 1.030   pH 6.0  5.0 - 8.0   Glucose, UA NEGATIVE  NEGATIVE mg/dL   Hgb urine dipstick LARGE (*) NEGATIVE   Bilirubin Urine NEGATIVE  NEGATIVE   Ketones, ur NEGATIVE  NEGATIVE mg/dL   Protein, ur >295>300 (*) NEGATIVE mg/dL   Urobilinogen, UA 0.2  0.0 - 1.0 mg/dL   Nitrite POSITIVE (*) NEGATIVE   Leukocytes, UA SMALL (*) NEGATIVE  URINE MICROSCOPIC-ADD ON     Status: Abnormal  Collection Time    06/09/13  5:30 AM      Result Value Ref Range   Squamous Epithelial / LPF RARE  RARE   WBC, UA TOO NUMEROUS TO COUNT  <3 WBC/hpf   RBC / HPF 21-50  <3 RBC/hpf   Bacteria, UA FEW (*) RARE  POCT PREGNANCY, URINE     Status: None   Collection Time    06/09/13  5:42 AM      Result Value Ref Range   Preg Test, Ur NEGATIVE  NEGATIVE  WET PREP, GENITAL     Status: Abnormal   Collection Time    06/09/13  5:55 AM      Result Value Ref Range   Yeast Wet Prep HPF POC NONE SEEN  NONE SEEN   Trich, Wet Prep NONE SEEN  NONE SEEN   Clue Cells Wet Prep HPF POC MODERATE (*) NONE SEEN   WBC, Wet Prep HPF POC MODERATE (*) NONE SEEN   Patient is feeling better after toradol and pyridium.   Assessment and Plan   1. UTI (lower urinary tract infection)     Started on Cipro 500mg  BID x 5 days Urine culture pending  Return to MAU as needed  Tawnya Crook 06/09/2013, 5:44 AM

## 2013-06-09 NOTE — MAU Note (Signed)
Pt woke up this am with extreme mid abd, back and vaginal pain.  Denies vag bleeding.

## 2013-06-10 LAB — GC/CHLAMYDIA PROBE AMP
CT Probe RNA: NEGATIVE
GC PROBE AMP APTIMA: NEGATIVE

## 2013-06-11 LAB — URINE CULTURE: Colony Count: >=100000

## 2014-01-19 ENCOUNTER — Encounter (HOSPITAL_COMMUNITY): Payer: Self-pay | Admitting: *Deleted

## 2014-11-14 ENCOUNTER — Encounter (HOSPITAL_COMMUNITY): Payer: Self-pay

## 2014-11-14 ENCOUNTER — Emergency Department (HOSPITAL_COMMUNITY)
Admission: EM | Admit: 2014-11-14 | Discharge: 2014-11-15 | Disposition: A | Discharge status: Home / Self Care | Payer: 59 | Attending: Emergency Medicine | Admitting: Emergency Medicine

## 2014-11-14 ENCOUNTER — Emergency Department (HOSPITAL_COMMUNITY): Payer: 59

## 2014-11-14 DIAGNOSIS — S29001A Unspecified injury of muscle and tendon of front wall of thorax, initial encounter: Secondary | ICD-10-CM | POA: Diagnosis present

## 2014-11-14 DIAGNOSIS — S3991XA Unspecified injury of abdomen, initial encounter: Secondary | ICD-10-CM | POA: Diagnosis not present

## 2014-11-14 DIAGNOSIS — Y9389 Activity, other specified: Secondary | ICD-10-CM | POA: Insufficient documentation

## 2014-11-14 DIAGNOSIS — Z791 Long term (current) use of non-steroidal anti-inflammatories (NSAID): Secondary | ICD-10-CM | POA: Diagnosis not present

## 2014-11-14 DIAGNOSIS — S199XXA Unspecified injury of neck, initial encounter: Secondary | ICD-10-CM | POA: Insufficient documentation

## 2014-11-14 DIAGNOSIS — Y998 Other external cause status: Secondary | ICD-10-CM | POA: Diagnosis not present

## 2014-11-14 DIAGNOSIS — S0081XA Abrasion of other part of head, initial encounter: Secondary | ICD-10-CM | POA: Diagnosis not present

## 2014-11-14 DIAGNOSIS — S8992XA Unspecified injury of left lower leg, initial encounter: Secondary | ICD-10-CM | POA: Insufficient documentation

## 2014-11-14 DIAGNOSIS — Y92410 Unspecified street and highway as the place of occurrence of the external cause: Secondary | ICD-10-CM | POA: Insufficient documentation

## 2014-11-14 DIAGNOSIS — Z87891 Personal history of nicotine dependence: Secondary | ICD-10-CM | POA: Diagnosis not present

## 2014-11-14 DIAGNOSIS — Z79899 Other long term (current) drug therapy: Secondary | ICD-10-CM | POA: Insufficient documentation

## 2014-11-14 DIAGNOSIS — Z3202 Encounter for pregnancy test, result negative: Secondary | ICD-10-CM | POA: Diagnosis not present

## 2014-11-14 LAB — I-STAT CG4 LACTIC ACID, ED: Lactic Acid, Venous: 2.24 mmol/L (ref 0.5–2.0)

## 2014-11-14 MED ORDER — SODIUM CHLORIDE 0.9 % IV BOLUS (SEPSIS)
1000.0000 mL | Freq: Once | INTRAVENOUS | Status: AC
  Administered 2014-11-14: 1000 mL via INTRAVENOUS
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
## 2014-11-14 NOTE — ED Notes (Signed)
Patient transported to X-ray 

## 2014-11-14 NOTE — ED Notes (Addendum)
Dr Mora Bellman given a copy of lactic acid results 2.24

## 2014-11-14 NOTE — ED Notes (Signed)
Dr. Oni at bedside. 

## 2014-11-15 ENCOUNTER — Encounter (HOSPITAL_COMMUNITY): Payer: Self-pay | Admitting: Radiology

## 2014-11-15 ENCOUNTER — Emergency Department (HOSPITAL_COMMUNITY): Payer: 59

## 2014-11-15 DIAGNOSIS — S29001A Unspecified injury of muscle and tendon of front wall of thorax, initial encounter: Secondary | ICD-10-CM | POA: Diagnosis not present

## 2014-11-15 LAB — COMPREHENSIVE METABOLIC PANEL
ALT: 17 U/L (ref 14–54)
AST: 23 U/L (ref 15–41)
Albumin: 3.5 g/dL (ref 3.5–5.0)
Alkaline Phosphatase: 67 U/L (ref 38–126)
Anion gap: 10 (ref 5–15)
BUN: 6 mg/dL (ref 6–20)
CHLORIDE: 108 mmol/L (ref 101–111)
CO2: 21 mmol/L — AB (ref 22–32)
CREATININE: 0.72 mg/dL (ref 0.44–1.00)
Calcium: 8.9 mg/dL (ref 8.9–10.3)
GFR calc Af Amer: >60 mL/min (ref >60)
GFR calc non Af Amer: >60 mL/min (ref >60)
Glucose, Bld: 105 mg/dL — ABNORMAL HIGH (ref 65–99)
Potassium: 3.5 mmol/L (ref 3.5–5.1)
SODIUM: 139 mmol/L (ref 135–145)
Total Bilirubin: 0.4 mg/dL (ref 0.3–1.2)
Total Protein: 7.1 g/dL (ref 6.5–8.1)

## 2014-11-15 LAB — CBC WITH DIFFERENTIAL/PLATELET
BASOS PCT: 1 % (ref 0–1)
Basophils Absolute: 0.1 10*3/uL (ref 0.0–0.1)
EOS ABS: 0.1 10*3/uL (ref 0.0–0.7)
EOS PCT: 1 % (ref 0–5)
HCT: 37.7 % (ref 36.0–46.0)
HEMOGLOBIN: 12.9 g/dL (ref 12.0–15.0)
LYMPHS ABS: 2.1 10*3/uL (ref 0.7–4.0)
Lymphocytes Relative: 31 % (ref 12–46)
MCH: 27.4 pg (ref 26.0–34.0)
MCHC: 34.2 g/dL (ref 30.0–36.0)
MCV: 80.2 fL (ref 78.0–100.0)
Monocytes Absolute: 0.5 10*3/uL (ref 0.1–1.0)
Monocytes Relative: 8 % (ref 3–12)
Neutro Abs: 3.9 10*3/uL (ref 1.7–7.7)
Neutrophils Relative %: 59 % (ref 43–77)
PLATELETS: 285 10*3/uL (ref 150–400)
RBC: 4.7 MIL/uL (ref 3.87–5.11)
RDW: 14.1 % (ref 11.5–15.5)
WBC: 6.6 10*3/uL (ref 4.0–10.5)

## 2014-11-15 LAB — URINALYSIS, ROUTINE W REFLEX MICROSCOPIC
Bilirubin Urine: NEGATIVE
Glucose, UA: NEGATIVE mg/dL
Hgb urine dipstick: NEGATIVE
Ketones, ur: NEGATIVE mg/dL
LEUKOCYTES UA: NEGATIVE
Nitrite: NEGATIVE
PROTEIN: NEGATIVE mg/dL
Specific Gravity, Urine: 1.003 — ABNORMAL LOW (ref 1.005–1.030)
UROBILINOGEN UA: 0.2 mg/dL (ref 0.0–1.0)
pH: 6 (ref 5.0–8.0)

## 2014-11-15 LAB — LIPASE, BLOOD: Lipase: 26 U/L (ref 22–51)

## 2014-11-15 LAB — POC URINE PREG, ED: PREG TEST UR: NEGATIVE

## 2014-11-15 MED ORDER — IOHEXOL 350 MG/ML SOLN
100.0000 mL | Freq: Once | INTRAVENOUS | Status: AC | PRN
  Administered 2014-11-15: 100 mL via INTRAVENOUS

## 2014-11-15 MED ORDER — TETANUS-DIPHTH-ACELL PERTUSSIS 5-2.5-18.5 LF-MCG/0.5 IM SUSP
0.5000 mL | Freq: Once | INTRAMUSCULAR | Status: AC
  Administered 2014-11-15: 0.5 mL via INTRAMUSCULAR
  Filled 2014-11-15: qty 0.5
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
## 2014-11-15 NOTE — ED Notes (Signed)
Dr. Oni at bedside. 

## 2018-12-31 ENCOUNTER — Other Ambulatory Visit: Payer: Self-pay

## 2018-12-31 ENCOUNTER — Encounter (HOSPITAL_BASED_OUTPATIENT_CLINIC_OR_DEPARTMENT_OTHER): Payer: Self-pay | Admitting: *Deleted

## 2018-12-31 ENCOUNTER — Emergency Department (HOSPITAL_BASED_OUTPATIENT_CLINIC_OR_DEPARTMENT_OTHER)
Admission: EM | Admit: 2018-12-31 | Discharge: 2018-12-31 | Disposition: A | Discharge status: Home / Self Care | Payer: BC Managed Care – PPO | Attending: Emergency Medicine | Admitting: Emergency Medicine

## 2018-12-31 DIAGNOSIS — Z87891 Personal history of nicotine dependence: Secondary | ICD-10-CM | POA: Insufficient documentation

## 2018-12-31 DIAGNOSIS — M542 Cervicalgia: Secondary | ICD-10-CM | POA: Diagnosis not present

## 2018-12-31 DIAGNOSIS — M62838 Other muscle spasm: Secondary | ICD-10-CM | POA: Diagnosis not present

## 2018-12-31 MED ORDER — ACETAMINOPHEN 500 MG PO TABS: 500.0000 mg | ORAL_TABLET | Freq: Four times a day (QID) | ORAL | 0 refills | Status: DC | PRN

## 2018-12-31 MED ORDER — PREDNISONE 10 MG (21) PO TBPK: ORAL_TABLET | Freq: Every day | ORAL | 0 refills | Status: DC

## 2018-12-31 MED ORDER — LIDOCAINE-EPINEPHRINE (PF) 2 %-1:200000 IJ SOLN
INTRAMUSCULAR | Status: AC
  Filled 2018-12-31: qty 10

## 2018-12-31 MED ORDER — LIDOCAINE 4 % EX CREA: 1.0000 "application " | TOPICAL_CREAM | Freq: Three times a day (TID) | CUTANEOUS | 0 refills | Status: DC | PRN

## 2018-12-31 MED ORDER — LIDOCAINE-EPINEPHRINE 2 %-1:100000 IJ SOLN
10.0000 mL | Freq: Once | INTRAMUSCULAR | Status: DC
  Filled 2018-12-31: qty 10.2

## 2018-12-31 MED FILL — predniSONE 10 MG TABS: 10 | 12 days supply | Qty: 42 | Fill #0

## 2018-12-31 NOTE — Discharge Instructions (Signed)
1. Medications: Take steroid taper as prescribed with food to avoid upset stomach issues.  Do not take ibuprofen, Advil, Aleve, or Motrin while taking this medicine.  You may take [id number] mg of Tylenol every 6 hours as needed for pain. Do not exceed 4000 mg of Tylenol daily.  You can take your muscle relaxer as needed for muscle relaxation but this medication may make you drowsy so do not drive, drink alcohol, operate heavy machinery, or make important decisions while you are using this medicine.  I typically recommend only taking this medicine at night.  You can also cut these tablets in half if they are very strong. Can also apply lidocaine cream to areas of pain. 2. Treatment: rest, drink plenty of fluids, apply heat 20 minutes at a time 3 or 4 times daily and do some gentle stretching (see attached exercises, but you can also YouTube search physical therapy exercises for neck spasm).   3. Follow Up: Please followup with your PCP in 1 week if no improvement for discussion of your diagnoses and further evaluation after today's visit; if you do not have a primary care doctor use the resource guide provided to find one; Please return to the ER for worsening symptoms or other concerns such as worsening swelling, redness of the skin, fevers, loss of pulses, or loss of feeling
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
## 2018-12-31 NOTE — ED Triage Notes (Signed)
C/o intermittent neck pain x 2 weeks

## 2019-04-08 ENCOUNTER — Encounter: Payer: Self-pay | Admitting: Physical Medicine and Rehabilitation

## 2019-04-18 ENCOUNTER — Encounter
Payer: BC Managed Care – PPO | Attending: Physical Medicine and Rehabilitation | Admitting: Physical Medicine and Rehabilitation

## 2019-04-18 ENCOUNTER — Encounter: Payer: Self-pay | Admitting: Physical Medicine and Rehabilitation

## 2019-04-18 ENCOUNTER — Other Ambulatory Visit: Payer: Self-pay

## 2019-04-18 VITALS — BP 124/69 | HR 89 | Temp 98.0°F | Ht 65.0 in | Wt 158.0 lb

## 2019-04-18 DIAGNOSIS — M4802 Spinal stenosis, cervical region: Secondary | ICD-10-CM | POA: Diagnosis present

## 2019-04-18 DIAGNOSIS — M5010 Cervical disc disorder with radiculopathy, unspecified cervical region: Secondary | ICD-10-CM

## 2019-04-18 DIAGNOSIS — M47812 Spondylosis without myelopathy or radiculopathy, cervical region: Secondary | ICD-10-CM

## 2019-04-18 MED ORDER — GABAPENTIN 300 MG PO CAPS: 300.0000 mg | ORAL_CAPSULE | Freq: Three times a day (TID) | ORAL | 0 refills | Status: DC
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
## 2019-05-02 ENCOUNTER — Other Ambulatory Visit: Payer: Self-pay | Admitting: Physical Medicine and Rehabilitation

## 2019-05-02 DIAGNOSIS — M19011 Primary osteoarthritis, right shoulder: Secondary | ICD-10-CM

## 2019-05-13 ENCOUNTER — Ambulatory Visit (HOSPITAL_BASED_OUTPATIENT_CLINIC_OR_DEPARTMENT_OTHER)
Admission: RE | Admit: 2019-05-13 | Discharge: 2019-05-13 | Disposition: A | Discharge status: Home / Self Care | Payer: BC Managed Care – PPO | Source: Ambulatory Visit | Attending: Physical Medicine and Rehabilitation | Admitting: Physical Medicine and Rehabilitation

## 2019-05-13 ENCOUNTER — Other Ambulatory Visit: Payer: Self-pay

## 2019-05-13 DIAGNOSIS — M19011 Primary osteoarthritis, right shoulder: Secondary | ICD-10-CM | POA: Insufficient documentation

## 2019-05-16 ENCOUNTER — Encounter: Payer: BC Managed Care – PPO | Admitting: Physical Medicine and Rehabilitation

## 2019-05-21 ENCOUNTER — Other Ambulatory Visit: Payer: Self-pay

## 2019-05-21 ENCOUNTER — Encounter: Payer: Self-pay | Admitting: Physical Medicine and Rehabilitation

## 2019-05-21 ENCOUNTER — Encounter
Payer: BC Managed Care – PPO | Attending: Physical Medicine and Rehabilitation | Admitting: Physical Medicine and Rehabilitation

## 2019-05-21 VITALS — BP 107/64 | HR 91 | Temp 98.5°F | Ht 65.0 in | Wt 156.0 lb

## 2019-05-21 DIAGNOSIS — M4802 Spinal stenosis, cervical region: Secondary | ICD-10-CM | POA: Insufficient documentation

## 2019-05-21 DIAGNOSIS — M7541 Impingement syndrome of right shoulder: Secondary | ICD-10-CM | POA: Insufficient documentation

## 2019-05-21 DIAGNOSIS — M47812 Spondylosis without myelopathy or radiculopathy, cervical region: Secondary | ICD-10-CM | POA: Insufficient documentation

## 2019-05-21 DIAGNOSIS — M5010 Cervical disc disorder with radiculopathy, unspecified cervical region: Secondary | ICD-10-CM | POA: Diagnosis present

## 2019-05-21 MED ORDER — GABAPENTIN 300 MG PO CAPS: 300.0000 mg | ORAL_CAPSULE | Freq: Three times a day (TID) | ORAL | 2 refills | Status: DC

## 2019-05-21 NOTE — Progress Notes (Deleted)
   Subjective:    Patient ID: Jennifer Roth, female    DOB: 11-01-1975, 44 y.o.   MRN: [id number]  HPI    Review of Systems     Objective:   Physical Exam        Assessment & Plan:
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
## 2019-06-23 ENCOUNTER — Ambulatory Visit: Payer: BC Managed Care – PPO | Admitting: Physical Medicine and Rehabilitation

## 2019-06-24 ENCOUNTER — Encounter: Payer: BC Managed Care – PPO | Admitting: Physical Medicine and Rehabilitation

## 2019-07-08 ENCOUNTER — Encounter
Payer: BC Managed Care – PPO | Attending: Physical Medicine and Rehabilitation | Admitting: Physical Medicine and Rehabilitation

## 2019-07-08 ENCOUNTER — Encounter: Payer: Self-pay | Admitting: Physical Medicine and Rehabilitation

## 2019-07-08 ENCOUNTER — Other Ambulatory Visit: Payer: Self-pay

## 2019-07-08 VITALS — Ht 65.0 in | Wt 156.0 lb

## 2019-07-08 DIAGNOSIS — M4802 Spinal stenosis, cervical region: Secondary | ICD-10-CM

## 2019-07-08 DIAGNOSIS — M5010 Cervical disc disorder with radiculopathy, unspecified cervical region: Secondary | ICD-10-CM | POA: Insufficient documentation

## 2019-07-08 DIAGNOSIS — M7541 Impingement syndrome of right shoulder: Secondary | ICD-10-CM

## 2019-07-08 DIAGNOSIS — M47812 Spondylosis without myelopathy or radiculopathy, cervical region: Secondary | ICD-10-CM | POA: Insufficient documentation

## 2019-07-08 MED ORDER — TIZANIDINE HCL 4 MG PO TABS: 4.0000 mg | ORAL_TABLET | Freq: Four times a day (QID) | ORAL | 3 refills | Status: DC | PRN
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
## 2019-07-10 ENCOUNTER — Encounter: Payer: Self-pay | Admitting: Physical Medicine and Rehabilitation

## 2019-07-14 ENCOUNTER — Encounter: Payer: Self-pay | Admitting: Physical Medicine and Rehabilitation

## 2019-07-15 ENCOUNTER — Encounter: Payer: Self-pay | Admitting: Physical Medicine and Rehabilitation

## 2019-07-15 DIAGNOSIS — M47812 Spondylosis without myelopathy or radiculopathy, cervical region: Secondary | ICD-10-CM

## 2019-07-15 DIAGNOSIS — M4802 Spinal stenosis, cervical region: Secondary | ICD-10-CM

## 2019-07-25 ENCOUNTER — Other Ambulatory Visit: Payer: Self-pay | Admitting: Physical Medicine and Rehabilitation

## 2019-07-25 DIAGNOSIS — M4802 Spinal stenosis, cervical region: Secondary | ICD-10-CM

## 2019-07-28 ENCOUNTER — Telehealth: Payer: Self-pay | Admitting: *Deleted

## 2019-08-01 ENCOUNTER — Other Ambulatory Visit: Payer: Self-pay | Admitting: Physical Medicine and Rehabilitation

## 2019-08-01 DIAGNOSIS — F411 Generalized anxiety disorder: Secondary | ICD-10-CM

## 2019-08-01 MED ORDER — DIAZEPAM 5 MG PO TABS: ORAL_TABLET | ORAL | 0 refills | Status: DC

## 2019-08-01 NOTE — Progress Notes (Signed)
Pre-procedure diazepam ordered for pre-operative anxiety.  

## 2019-08-05 ENCOUNTER — Encounter: Payer: Self-pay | Admitting: Physical Medicine and Rehabilitation

## 2019-08-05 ENCOUNTER — Other Ambulatory Visit: Payer: Self-pay

## 2019-08-05 ENCOUNTER — Encounter
Payer: BC Managed Care – PPO | Attending: Physical Medicine and Rehabilitation | Admitting: Physical Medicine and Rehabilitation

## 2019-08-05 VITALS — BP 116/81 | HR 92 | Temp 97.5°F | Ht 65.0 in | Wt 156.6 lb

## 2019-08-05 DIAGNOSIS — M4802 Spinal stenosis, cervical region: Secondary | ICD-10-CM

## 2019-08-05 DIAGNOSIS — M5010 Cervical disc disorder with radiculopathy, unspecified cervical region: Secondary | ICD-10-CM | POA: Diagnosis present

## 2019-08-05 DIAGNOSIS — G5603 Carpal tunnel syndrome, bilateral upper limbs: Secondary | ICD-10-CM | POA: Diagnosis not present

## 2019-08-05 DIAGNOSIS — M47812 Spondylosis without myelopathy or radiculopathy, cervical region: Secondary | ICD-10-CM | POA: Diagnosis present

## 2019-08-05 DIAGNOSIS — M7541 Impingement syndrome of right shoulder: Secondary | ICD-10-CM | POA: Diagnosis present
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
## 2019-08-06 ENCOUNTER — Encounter: Payer: Self-pay | Admitting: Physical Medicine and Rehabilitation

## 2019-08-25 ENCOUNTER — Ambulatory Visit (INDEPENDENT_AMBULATORY_CARE_PROVIDER_SITE_OTHER): Payer: BC Managed Care – PPO | Admitting: Physical Medicine and Rehabilitation

## 2019-08-25 ENCOUNTER — Other Ambulatory Visit: Payer: Self-pay

## 2019-08-25 ENCOUNTER — Ambulatory Visit: Payer: Self-pay

## 2019-08-25 VITALS — BP 122/72 | HR 82

## 2019-08-25 DIAGNOSIS — M47812 Spondylosis without myelopathy or radiculopathy, cervical region: Secondary | ICD-10-CM | POA: Diagnosis not present

## 2019-08-25 MED ORDER — METHYLPREDNISOLONE ACETATE 80 MG/ML IJ SUSP
40.0000 mg | Freq: Once | INTRAMUSCULAR | Status: AC
  Administered 2019-08-25: 40 mg

## 2019-08-25 NOTE — Progress Notes (Signed)
Numeric Pain Rating Scale and Functional Assessment Average Pain 7   In the last MONTH (on 0-10 scale) has pain interfered with the following?  1. General activity like being  able to carry out your everyday physical activities such as walking, climbing stairs, carrying groceries, or moving a chair?  Rating(6-7)   +Driver, -BT, -Dye Allergies. Pain on right side, sits with arms over head only way to get relief, mixing eggs up or lifting anything bothers her, can't hold head looking down for long before pain starts, Dr. Was rubbing arms checking sensation and she felt it more on the left than on the right.

## 2019-08-26 NOTE — Procedures (Signed)
Cervical Facet Joint Intra-Articular Injection with Fluoroscopic Guidance  Patient: Jennifer Roth      Date of Birth: 1975-05-27 MRN: [id number] PCP: Verlon Au, MD      Visit Date: 08/25/2019   Universal Protocol:    Date/Time: 06/08/215:29 AM  Consent Given By: the patient  Position: PRONE  Additional Comments: Vital signs were monitored before and after the procedure. Patient was prepped and draped in the usual sterile fashion. The correct patient, procedure, and site was verified.   Injection Procedure Details:  Procedure Site One Meds Administered:  Meds ordered this encounter  Medications  . methylPREDNISolone acetate (DEPO-MEDROL) injection 40 mg     Laterality: Right  Location/Site:  C5-6  Needle size: 25 G  Needle type: Spinal  Needle Placement: Articular  Findings:  -Contrast Used: 0.5 mL iohexol 180 mg iodine/mL   -Comments: Excellent flow of contrast producing a partial arthrogram.  Procedure Details: The region overlying the facet joints mentioned above were localized under fluoroscopic visualization. The needle was inserted down to the level of the lateral mass of the superior articular process of the facet joint to be injected. Then, the needle was "walked off" inferiorly into the lateral aspect of the facet joint. Bi-planar images were used for confirming placement and spot radiographs were documented.  A 0.25 ml volume of Omnipaque-240 was injected into the facet joint and a standard partial arthrogram was obtained. Radiographs were obtained of the arthrogram. A 0.5 ml. volume of the steroid/anesthetic solution was injected into the joint. This procedure was repeated for each facet joint injected.   Additional Comments:  The patient tolerated the procedure well Dressing: Band-Aid    Post-procedure details: Patient was observed during the procedure. Post-procedure instructions were reviewed.  Patient left the clinic in stable  condition.
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
## 2019-09-02 ENCOUNTER — Encounter: Payer: Self-pay | Admitting: Physical Medicine and Rehabilitation

## 2019-09-03 ENCOUNTER — Encounter
Payer: BC Managed Care – PPO | Attending: Physical Medicine and Rehabilitation | Admitting: Physical Medicine and Rehabilitation

## 2019-09-03 ENCOUNTER — Other Ambulatory Visit: Payer: Self-pay

## 2019-09-03 VITALS — BP 104/69 | HR 81 | Temp 98.5°F | Ht 65.0 in | Wt 157.2 lb

## 2019-09-03 DIAGNOSIS — M5010 Cervical disc disorder with radiculopathy, unspecified cervical region: Secondary | ICD-10-CM | POA: Insufficient documentation

## 2019-09-03 DIAGNOSIS — M47812 Spondylosis without myelopathy or radiculopathy, cervical region: Secondary | ICD-10-CM | POA: Insufficient documentation

## 2019-09-03 DIAGNOSIS — M4802 Spinal stenosis, cervical region: Secondary | ICD-10-CM | POA: Diagnosis present

## 2019-09-03 DIAGNOSIS — G5603 Carpal tunnel syndrome, bilateral upper limbs: Secondary | ICD-10-CM | POA: Diagnosis not present

## 2019-09-03 DIAGNOSIS — M7541 Impingement syndrome of right shoulder: Secondary | ICD-10-CM | POA: Diagnosis not present

## 2019-09-03 DIAGNOSIS — M19011 Primary osteoarthritis, right shoulder: Secondary | ICD-10-CM
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
## 2019-09-03 NOTE — Patient Instructions (Signed)
Thera cane

## 2019-09-04 ENCOUNTER — Encounter: Payer: Self-pay | Admitting: Physical Medicine and Rehabilitation

## 2019-09-15 ENCOUNTER — Other Ambulatory Visit: Payer: Self-pay | Admitting: Physical Medicine and Rehabilitation

## 2019-09-15 DIAGNOSIS — M4802 Spinal stenosis, cervical region: Secondary | ICD-10-CM

## 2019-09-17 ENCOUNTER — Encounter: Payer: Self-pay | Admitting: Physical Medicine and Rehabilitation

## 2019-09-26 ENCOUNTER — Encounter: Payer: BC Managed Care – PPO | Admitting: Physical Medicine & Rehabilitation

## 2019-10-01 ENCOUNTER — Encounter
Payer: BC Managed Care – PPO | Attending: Physical Medicine and Rehabilitation | Admitting: Physical Medicine and Rehabilitation

## 2019-10-01 DIAGNOSIS — M7541 Impingement syndrome of right shoulder: Secondary | ICD-10-CM | POA: Insufficient documentation

## 2019-10-01 DIAGNOSIS — M47812 Spondylosis without myelopathy or radiculopathy, cervical region: Secondary | ICD-10-CM | POA: Insufficient documentation

## 2019-10-01 DIAGNOSIS — M5010 Cervical disc disorder with radiculopathy, unspecified cervical region: Secondary | ICD-10-CM | POA: Insufficient documentation

## 2019-10-01 DIAGNOSIS — M4802 Spinal stenosis, cervical region: Secondary | ICD-10-CM | POA: Insufficient documentation

## 2019-10-06 ENCOUNTER — Other Ambulatory Visit: Payer: Self-pay | Admitting: Physical Medicine and Rehabilitation

## 2019-10-06 ENCOUNTER — Encounter: Payer: Self-pay | Admitting: Physical Medicine and Rehabilitation

## 2019-10-06 MED ORDER — GABAPENTIN 400 MG PO CAPS: 400.0000 mg | ORAL_CAPSULE | Freq: Three times a day (TID) | ORAL | 2 refills | Status: DC

## 2019-10-07 ENCOUNTER — Other Ambulatory Visit: Payer: Self-pay

## 2019-10-07 ENCOUNTER — Encounter: Payer: Self-pay | Admitting: Physical Therapy

## 2019-10-07 ENCOUNTER — Ambulatory Visit: Payer: BC Managed Care – PPO | Attending: Physical Medicine and Rehabilitation | Admitting: Physical Therapy

## 2019-10-07 DIAGNOSIS — M542 Cervicalgia: Secondary | ICD-10-CM | POA: Insufficient documentation

## 2019-10-07 DIAGNOSIS — M5412 Radiculopathy, cervical region: Secondary | ICD-10-CM | POA: Insufficient documentation

## 2019-10-07 DIAGNOSIS — R293 Abnormal posture: Secondary | ICD-10-CM | POA: Insufficient documentation

## 2019-10-07 DIAGNOSIS — M6281 Muscle weakness (generalized): Secondary | ICD-10-CM | POA: Insufficient documentation

## 2019-10-07 DIAGNOSIS — M62838 Other muscle spasm: Secondary | ICD-10-CM | POA: Insufficient documentation

## 2019-10-07 NOTE — Patient Instructions (Addendum)
Home exercise program created by Glenetta Hew, PT.  For questions, please contact Zymiere Trostle via phone at [phone] or email at [email or web address]  [facility] [address]  [address] [place], [place], [address] Phone: [phone]   Fax:  [phone]       TENS UNIT  This is helpful for muscle pain and spasm.   Search and Purchase a TENS 7000 2nd edition at www.tenspros.com or www.amazon.com  (It should be less than $30)     TENS unit instructions:   Do not shower or bathe with the unit on  Turn the unit off before removing electrodes or batteries  If the electrodes lose stickiness add a drop of water to the electrodes after they are disconnected from the unit and place on plastic sheet. If you continued to have difficulty, call the TENS unit company to purchase more electrodes.  Do not apply lotion on the skin area prior to use. Make sure the skin is clean and dry as this will help prolong the life of the electrodes.  After use, always check skin for unusual red areas, rash or other skin difficulties. If there are any skin problems, does not apply electrodes to the same area.  Never remove the electrodes from the unit by pulling the wires.  Do not use the TENS unit or electrodes other than as directed.  Do not change electrode placement without consulting your therapist or physician.  Keep 2 fingers with between each electrode.   TENS stands for Transcutaneous Electrical Nerve Stimulation. In other words, electrical impulses are allowed to pass through the skin in order to excite a nerve.   Purpose and Use of TENS:  TENS is a method used to manage acute and chronic pain without the use of drugs. It has been effective in managing pain associated with surgery, sprains, strains, trauma, rheumatoid arthritis, and neuralgias. It is a non-addictive, low risk, and non-invasive technique used to control pain.  It is not, by any means, a curative form of treatment.   How TENS Works:  Most TENS units are a Statistician unit powered by one 9 volt battery. Attached to the outside of the unit are two lead wires where two pins and/or snaps connect on each wire. All units come with a set of four reusable pads or electrodes. These are placed on the skin surrounding the area involved. By inserting the leads into  the pads, the electricity can pass from the unit making the circuit complete.  As the intensity is turned up slowly, the electrical current enters the body from the electrodes through the skin to the surrounding nerve fibers. This triggers the release of hormones from within the body. These hormones contain pain relievers. By increasing the circulation of these hormones, the person's pain may be lessened. It is also believed that the electrical stimulation itself helps to block the pain messages being sent to the brain, thus also decreasing the body's perception of pain.   Hazards:  TENS units are NOT to be used by patients with PACEMAKERS, DEFIBRILLATORS, DIABETIC PUMPS, PREGNANT WOMEN, and patients with SEIZURE DISORDERS.  TENS units are NOT to be used over the heart, throat, brain, or spinal cord.  One of the major side effects from the TENS unit may be skin irritation. Some people may develop a rash if they are sensitive to the materials used in the electrodes or the connecting wires.   Wear the unit for up  to 30-45 minutes, 3-4x/day as needed for pain or muscle spasms.   Avoid overuse due the body getting used to the stem making it not as effective over time.    Trigger Point Dry Needling  . What is Trigger Point Dry Needling (DN)? o DN is a physical therapy technique used to treat muscle pain and dysfunction. Specifically, DN helps deactivate muscle trigger points (muscle knots).  o A thin filiform needle is used to penetrate the skin and stimulate the underlying trigger point. The goal is  for a local twitch response (LTR) to occur and for the trigger point to relax. No medication of any kind is injected during the procedure.   . What Does Trigger Point Dry Needling Feel Like?  o The procedure feels different for each individual patient. Some patients report that they do not actually feel the needle enter the skin and overall the process is not painful. Very mild bleeding may occur. However, many patients feel a deep cramping in the muscle in which the needle was inserted. This is the local twitch response.   Marland Kitchen How Will I feel after the treatment? o Soreness is normal, and the onset of soreness may not occur for a few hours. Typically this soreness does not last longer than two days.  o Bruising is uncommon, however; ice can be used to decrease any possible bruising.  o In rare cases feeling tired or nauseous after the treatment is normal. In addition, your symptoms may get worse before they get better, this period will typically not last longer than 24 hours.   . What Can I do After My Treatment? o Increase your hydration by drinking more water for the next 24 hours. o You may place ice or heat on the areas treated that have become sore, however, do not use heat on inflamed or bruised areas. Heat often brings more relief post needling. o You can continue your regular activities, but vigorous activity is not recommended initially after the treatment for 24 hours. o DN is best combined with other physical therapy such as strengthening, stretching, and other therapies.
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
## 2019-10-14 ENCOUNTER — Telehealth: Payer: Self-pay

## 2019-10-14 NOTE — Telephone Encounter (Signed)
Phoned member service [phone] per Gwenyth Ober - 12:26pm [id number] for [phone] and [phone] for [phone] require no procedure if performed OP ref # [id number]-- she states this policy is out of [place] - but anyone who answer member service line can assist -

## 2019-10-15 ENCOUNTER — Ambulatory Visit: Payer: BC Managed Care – PPO

## 2019-10-15 ENCOUNTER — Other Ambulatory Visit: Payer: Self-pay

## 2019-10-15 DIAGNOSIS — M62838 Other muscle spasm: Secondary | ICD-10-CM

## 2019-10-15 DIAGNOSIS — M5412 Radiculopathy, cervical region: Secondary | ICD-10-CM

## 2019-10-15 DIAGNOSIS — M542 Cervicalgia: Secondary | ICD-10-CM | POA: Diagnosis not present

## 2019-10-15 DIAGNOSIS — R293 Abnormal posture: Secondary | ICD-10-CM

## 2019-10-15 DIAGNOSIS — M6281 Muscle weakness (generalized): Secondary | ICD-10-CM
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
## 2019-10-16 ENCOUNTER — Encounter (HOSPITAL_BASED_OUTPATIENT_CLINIC_OR_DEPARTMENT_OTHER): Payer: BC Managed Care – PPO | Admitting: Physical Medicine & Rehabilitation

## 2019-10-16 ENCOUNTER — Encounter: Payer: Self-pay | Admitting: Physical Medicine & Rehabilitation

## 2019-10-16 ENCOUNTER — Other Ambulatory Visit: Payer: Self-pay

## 2019-10-16 ENCOUNTER — Encounter: Payer: Self-pay | Admitting: Physical Medicine and Rehabilitation

## 2019-10-16 VITALS — BP 130/86 | HR 86 | Temp 98.9°F | Ht 65.0 in | Wt 150.0 lb

## 2019-10-16 DIAGNOSIS — M4802 Spinal stenosis, cervical region: Secondary | ICD-10-CM | POA: Diagnosis present

## 2019-10-16 DIAGNOSIS — M7541 Impingement syndrome of right shoulder: Secondary | ICD-10-CM

## 2019-10-16 DIAGNOSIS — M5010 Cervical disc disorder with radiculopathy, unspecified cervical region: Secondary | ICD-10-CM | POA: Diagnosis present

## 2019-10-16 DIAGNOSIS — M47812 Spondylosis without myelopathy or radiculopathy, cervical region: Secondary | ICD-10-CM | POA: Diagnosis not present

## 2019-10-16 NOTE — Progress Notes (Signed)
CC:  Neck pain with hand tingling  Bilateral shoulder pain   Acupuncture with E stim 25 min total  Bilateral GB 20 Right BL 10 Right BL 10' (1.5 kun lateral to C5 spinous process Bilateral  BL 11 Right GB 21 Right S I 10  Electrical stimulation between left GB 20 and left BL 11 Electrical stimulation between right GB 20 right BL 10 to GB 21  Electrical stimulation between right BL 10 prime, right BL 11 to right GB 21  Patient tolerated procedure well post procedure instructions given we will schedule 5 more visits on a weekly basis

## 2019-10-16 NOTE — Patient Instructions (Signed)
Acupuncture Acupuncture is a type of treatment that involves stimulating specific points on your body by inserting thin needles through your skin. Acupuncture is often used to treat pain, but it may also be used to help relieve other types of symptoms. Your health care provider may recommend acupuncture to help treat various conditions, such as:  Migraine headaches.  Tension headaches.  Arthritis pain.  Addiction.  Chronic pain.  Nausea and vomiting after a surgery.  High blood pressure (hypertension).  Chronic obstructive pulmonary disease (COPD).  Nausea caused by cancer treatment.  Sudden or severe (acute) pain. Acupuncture is based on traditional Chinese medicine, which recognizes more than 2,000 points on the body that connect energy pathways (meridians) through the body. The goal in stimulating these points is to balance the physical, emotional, and mental energy in your body. Acupuncture is done by a health care provider who has specialized training (licensed acupuncture practitioner). Treatment often requires several acupuncture sessions. You may have acupuncture along with other medical treatments. Tell a health care provider about:  Any allergies you have.  All medicines you are taking, including vitamins, herbs, eye drops, creams, and over-the-counter medicines.  Any blood disorders you have.  Any surgeries you have had.  Any medical conditions you have.  Whether you are pregnant or may be pregnant. What are the risks? Generally, this is a safe treatment. However, problems may occur, including:  Skin infection.  Damage to organs or structures that are under the skin where a needle is placed. What happens before the treatment?  Your acupuncture practitioner will ask about your medical history and your symptoms.  You may have a physical exam. What happens during the treatment? The exact procedure will depend on your condition and how your acupuncture provider  treats it. In general:  Your skin will be cleaned with a germ-killing (antiseptic) solution.  Your acupuncture practitioner will open a new set of germ-free (sterile) needles.  The needles will be gently inserted into your skin. They will be left in place for a certain amount of time. You may feel slight pain or a tingling sensation.  Your acupuncture practitioner may: ? Apply electrical energy to the needles. ? Adjust the needles in certain ways.  After your procedure, the acupuncture practitioner will remove the needles, throw them away, and clean your skin. The procedure may vary among health care providers. What can I expect after the treatment? People react differently to acupuncture. Make sure you ask your acupuncture provider what to expect after your treatment. It is common to have:  Minor bruising.  Mild pain.  A small amount of bleeding. Follow these instructions at home:  Follow any instructions given by your provider after the treatment.  Keep all follow-up visits as told by your health care provider. This is important. Contact a health care provider if:  You have questions about your reaction to the treatment.  You have soreness.  You have skin irritation or redness.  You have a fever. Summary  Acupuncture is a type of treatment that involves stimulating specific points on your body by inserting thin needles through your skin.  This treatment is often used to treat pain, but it may also be used to help relieve other types of symptoms.  The exact procedure will depend on your condition and how your acupuncture provider treats it. This information is not intended to replace advice given to you by your health care provider. Make sure you discuss any questions you have with your health   care provider. Document Revised: 02/16/2017 Document Reviewed: 12/01/2016 Elsevier Patient Education  2020 [employer]  

## 2019-10-17 ENCOUNTER — Ambulatory Visit: Payer: BC Managed Care – PPO | Admitting: Physical Therapy

## 2019-10-22 ENCOUNTER — Encounter: Payer: Self-pay | Admitting: Physical Therapy

## 2019-10-22 ENCOUNTER — Other Ambulatory Visit: Payer: Self-pay

## 2019-10-22 ENCOUNTER — Ambulatory Visit: Payer: BC Managed Care – PPO | Attending: Physical Medicine and Rehabilitation | Admitting: Physical Therapy

## 2019-10-22 DIAGNOSIS — M542 Cervicalgia: Secondary | ICD-10-CM | POA: Diagnosis not present

## 2019-10-22 DIAGNOSIS — M5412 Radiculopathy, cervical region: Secondary | ICD-10-CM | POA: Diagnosis present

## 2019-10-22 DIAGNOSIS — M6281 Muscle weakness (generalized): Secondary | ICD-10-CM | POA: Diagnosis present

## 2019-10-22 DIAGNOSIS — M62838 Other muscle spasm: Secondary | ICD-10-CM | POA: Diagnosis present

## 2019-10-22 DIAGNOSIS — R293 Abnormal posture: Secondary | ICD-10-CM | POA: Diagnosis present
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
## 2019-10-22 NOTE — Patient Instructions (Addendum)
  Home exercise program created by Rajiv Parlato, PT.  For questions, please contact Teresa Lemmerman via phone at [phone] or email at [email or web address]  [facility] [place] [address]  [address] [place], [place], [address] Phone: [phone]   Fax:  [phone]     Sleeping on Back  Place pillow under knees. A pillow with cervical support and a roll around waist are also helpful. Copyright  VHI. All rights reserved.  Sleeping on Side Place pillow between knees. Use cervical support under neck and a roll around waist as needed. Copyright  VHI. All rights reserved.   Sleeping on Stomach   If this is the only desirable sleeping position, place pillow under lower legs, and under stomach or chest as needed.  Posture - Sitting   Sit upright, head facing forward. Try using a roll to support lower back. Keep shoulders relaxed, and avoid rounded back. Keep hips level with knees. Avoid crossing legs for long periods. Stand to Sit / Sit to Stand   To sit: Bend knees to lower self onto front edge of chair, then scoot back on seat. To stand: Reverse sequence by placing one foot forward, and scoot to front of seat. Use rocking motion to stand up.   Work Height and Reach  Ideal work height is no more than 2 to 4 inches below elbow level when standing, and at elbow level when sitting. Reaching should be limited to arm's length, with elbows slightly bent.  Bending  Bend at hips and knees, not back. Keep feet shoulder-width apart.    Posture - Standing   Good posture is important. Avoid slouching and forward head thrust. Maintain curve in low back and align ears over shoul- ders, hips over ankles.  Alternating Positions   Alternate tasks and change positions frequently to reduce fatigue and muscle tension. Take rest breaks. Computer Work   Position work to face forward. Use proper work and seat height. Keep shoulders back  and down, wrists straight, and elbows at right angles. Use chair that provides full back support. Add footrest and lumbar roll as needed.  Getting Into / Out of Car  Lower self onto seat, scoot back, then bring in one leg at a time. Reverse sequence to get out.  Dressing  Lie on back to pull socks or slacks over feet, or sit and bend leg while keeping back straight.    Housework - Sink  Place one foot on ledge of cabinet under sink when standing at sink for prolonged periods.   Pushing / Pulling  Pushing is preferable to pulling. Keep back in proper alignment, and use leg muscles to do the work.  Deep Squat   Squat and lift with both arms held against upper trunk. Tighten stomach muscles without holding breath. Use smooth movements to avoid jerking.  Avoid Twisting   Avoid twisting or bending back. Pivot around using foot movements, and bend at knees if needed when reaching for articles.  Carrying Luggage   Distribute weight evenly on both sides. Use a cart whenever possible. Do not twist trunk. Move body as a unit.   Lifting Principles .Maintain proper posture and head alignment. .Slide object as close as possible before lifting. .Move obstacles out of the way. .Test before lifting; ask for help if too heavy. .Tighten stomach muscles without holding breath. .Use smooth movements; do not jerk. .Use legs to do the work, and pivot with feet. .Distribute the work load symmetrically and   close to the center of trunk. .Push instead of pull whenever possible.   Ask For Help   Ask for help and delegate to others when possible. Coordinate your movements when lifting together, and maintain the low back curve.  Log Roll   Lying on back, bend left knee and place left arm across chest. Roll all in one movement to the right. Reverse to roll to the left. Always move as one unit. Housework - Sweeping  Use long-handled equipment to avoid stooping.   Housework -  Wiping  Position yourself as close as possible to reach work surface. Avoid straining your back.  Laundry - Unloading Wash   To unload small items at bottom of washer, lift leg opposite to arm being used to reach.  Gardening - Raking  Move close to area to be raked. Use arm movements to do the work. Keep back straight and avoid twisting.     Cart  When reaching into cart with one arm, lift opposite leg to keep back straight.   Getting Into / Out of Bed  Lower self to lie down on one side by raising legs and lowering head at the same time. Use arms to assist moving without twisting. Bend both knees to roll onto back if desired. To sit up, start from lying on side, and use same move-ments in reverse. Housework - Vacuuming  Hold the vacuum with arm held at side. Step back and forth to move it, keeping head up. Avoid twisting.   Laundry - Loading Wash  Position laundry basket so that bending and twisting can be avoided.   Laundry - Unloading Dryer  Squat down to reach into clothes dryer or use a reacher.  Gardening - Weeding / Planting  Squat or Kneel. Knee pads may be helpful.                    

## 2019-10-24 ENCOUNTER — Ambulatory Visit: Payer: BC Managed Care – PPO

## 2019-10-24 ENCOUNTER — Other Ambulatory Visit: Payer: Self-pay

## 2019-10-24 DIAGNOSIS — M542 Cervicalgia: Secondary | ICD-10-CM

## 2019-10-24 DIAGNOSIS — M62838 Other muscle spasm: Secondary | ICD-10-CM

## 2019-10-24 DIAGNOSIS — R293 Abnormal posture: Secondary | ICD-10-CM

## 2019-10-24 DIAGNOSIS — M6281 Muscle weakness (generalized): Secondary | ICD-10-CM

## 2019-10-24 DIAGNOSIS — M5412 Radiculopathy, cervical region: Secondary | ICD-10-CM

## 2019-10-24 NOTE — Therapy (Signed)
[facility] [address]  [address] [place], [place], [address] Phone: [phone]   Fax:  [phone]  Physical Therapy Treatment  Patient Details  Name: Jennifer Roth MRN: [id number] Date of Birth: Jun 13, 1975 Referring Provider (PT): Horton Chin, MD   Encounter Date: 10/24/2019   PT End of Session - 10/24/19 0946    Visit Number 4    Number of Visits 12    Date for PT Re-Evaluation 11/18/19    Authorization Type BCBS    PT Start Time 0934    PT Stop Time 1013    PT Time Calculation (min) 39 min    Activity Tolerance Patient tolerated treatment well    Behavior During Therapy [facility] for tasks assessed/performed           Past Medical History:  Diagnosis Date   Medical history non-contributory    No pertinent past medical history     Past Surgical History:  Procedure Laterality Date   TUBAL LIGATION  2003    There were no vitals filed for this visit.   Subjective Assessment - 10/24/19 0943    Subjective Pt. doing ok.  Notes R arm heaviness at times.    Diagnostic tests 03/11/19 Cervical MRI: Mild degenerative changes as detailed above without high-grade stenosis. 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    Patient Stated Goals "to try to lessen the pain as much as possible"    Currently in Pain? Yes    Pain Score 4     Pain Location Shoulder    Pain Orientation Right    Pain Descriptors / Indicators Heaviness    Pain Type Chronic pain    Pain Radiating Towards numbness/tingling in B UE (R>L)    Pain Frequency Intermittent    Multiple Pain Sites No                             [facility] Adult PT Treatment/Exercise - 10/24/19 0001      Neck Exercises: Machines for Strengthening   UBE (Upper Arm Bike) L2.0 x 6' (3' fwd/3' back)    Lat Pull 15# x 15 reps - low       Neck Exercises: Theraband   Shoulder  Extension 10 reps;Red    Shoulder Extension Limitations tactile cues for scapular retraction/depression     Rows 10 reps;Red    Horizontal ABduction 10 reps;Red    Horizontal ABduction Limitations Hooklying on pool noodle     Other Theraband Exercises Alteranting (X pattern) shoulder flexion/extension with red TB x 10 reps       Neck Exercises: Stretches   Upper Trapezius Stretch Right;Left;30 seconds;1 rep    Levator Stretch Right;Left;30 seconds;1 rep                    PT Short Term Goals - 10/22/19 1015      PT SHORT TERM GOAL #1   Title Patient will be independent with initial HEP    Status Achieved      PT SHORT TERM GOAL #2   Title Patient will verbalize/demonstrate good awareness of neutral spine posture and proper body mechanics for daily tasks    Status On-going    Target Date 10/28/19             PT Long Term Goals - 10/22/19 1016  PT LONG TERM GOAL #1   Title Patient will be independent with ongoing/advanced HEP    Status On-going    Target Date 11/18/19      PT LONG TERM GOAL #2   Title Patient to demonstrate appropriate posture and body mechanics needed for daily activities    Status On-going    Target Date 11/18/19      PT LONG TERM GOAL #3   Title Patient to improve tissue quality as noted by reduced tissue tightness and tenderness to palpation    Status On-going    Target Date 11/18/19      PT LONG TERM GOAL #4   Title Patient to report pain reduction in frequency and intensity by >/= 50%    Status On-going      PT LONG TERM GOAL #5   Title Patient to improve cervical & B shoulder AROM to WNL/WFL without pain provocation    Status On-going    Target Date 11/18/19      PT LONG TERM GOAL #6   Title Patient to report ability to perform ADLs, household and work-related tasks without increased pain    Status On-going    Target Date 11/18/19      PT LONG TERM GOAL #7   Title Patient will report no sleep disturbance due to pain     Status On-going    Target Date 11/18/19                 Plan - 10/24/19 0947    Clinical Impression Statement Jennifer Roth doing well today.  notes some reduction in numbness/tingling in UEs since starting therapy.  Reviewed red TB periscapular strengthening activities with good carryover of technique.  Did have increased numbness in UE with scap. retraction holds along with compliant of "pressure" in neck which was relieved with rest and technique correction.  Ended visit pain free thus modalities deferred.    Rehab Potential Good    PT Frequency 2x / week    PT Treatment/Interventions ADLs/Self Care Home Management;Cryotherapy;Electrical Stimulation;Iontophoresis 4mg /ml Dexamethasone;Moist Heat;Traction;Ultrasound;Functional mobility training;Therapeutic activities;Therapeutic exercise;Neuromuscular re-education;Patient/family education;Manual techniques;Passive range of motion;Dry needling;Taping;Spinal Manipulations    PT Next Visit Plan Postural stretching and strengthening; manual therapy to address increased muscle tension; posture and body mechanics education; modalities PRN    PT Home Exercise Plan 7/20 - chin tuck, UT & LS stretches, scap retraction, 8/4 - red TB rows & scap retraction/shoulder extension    Consulted and Agree with Plan of Care Patient           Patient will benefit from skilled therapeutic intervention in order to improve the following deficits and impairments:  Decreased activity tolerance, Decreased knowledge of precautions, Decreased mobility, Decreased range of motion, Decreased strength, Dizziness, Increased fascial restricitons, Increased muscle spasms, Impaired perceived functional ability, Impaired flexibility, Impaired sensation, Impaired UE functional use, Improper body mechanics, Postural dysfunction, Pain  Visit Diagnosis: Cervicalgia  Radiculopathy, cervical region  Other muscle spasm  Abnormal posture  Muscle weakness  (generalized)     Problem List There are no problems to display for this patient.   8/20, PTA 10/24/19 12:23 PM   [facility] [address]  [address] [place], [place], [place] Phone: [phone]   Fax:  [phone]  Name: Jennifer Roth MRN: Elissa Lovett Date of Birth: [birth year]

## 2019-10-29 ENCOUNTER — Ambulatory Visit: Payer: BC Managed Care – PPO | Admitting: Physical Therapy

## 2019-10-30 ENCOUNTER — Other Ambulatory Visit: Payer: Self-pay

## 2019-10-30 ENCOUNTER — Encounter
Payer: BC Managed Care – PPO | Attending: Physical Medicine and Rehabilitation | Admitting: Physical Medicine & Rehabilitation

## 2019-10-30 ENCOUNTER — Encounter: Payer: Self-pay | Admitting: Physical Medicine & Rehabilitation

## 2019-10-30 DIAGNOSIS — M47812 Spondylosis without myelopathy or radiculopathy, cervical region: Secondary | ICD-10-CM | POA: Insufficient documentation

## 2019-10-30 DIAGNOSIS — M4802 Spinal stenosis, cervical region: Secondary | ICD-10-CM | POA: Diagnosis present

## 2019-10-30 DIAGNOSIS — M5010 Cervical disc disorder with radiculopathy, unspecified cervical region: Secondary | ICD-10-CM | POA: Diagnosis present

## 2019-10-30 DIAGNOSIS — M7541 Impingement syndrome of right shoulder: Secondary | ICD-10-CM | POA: Insufficient documentation

## 2019-10-30 NOTE — Progress Notes (Signed)
CC:  Neck pain with hand tingling  Bilateral shoulder pain   Acupuncture with E stim 25 min total  Bilateral GB 20 Right BL 10 Right BL 10' (1.5 kun lateral to C5 spinous process Bilateral  BL 11 Bilateral GB 21 Bilateral S I 13  Electrical stimulation between right/ left GB 21 and right/left SI 13 Electrical stimulation between cross needles of GBM 20 BL 10 to BL10' to BL 11-bilaterally  Patient tolerated procedure well post procedure instructions given we will schedule 4 more visits on a weekly basis

## 2019-10-31 ENCOUNTER — Ambulatory Visit: Payer: BC Managed Care – PPO

## 2019-10-31 DIAGNOSIS — R293 Abnormal posture: Secondary | ICD-10-CM

## 2019-10-31 DIAGNOSIS — M6281 Muscle weakness (generalized): Secondary | ICD-10-CM

## 2019-10-31 DIAGNOSIS — M62838 Other muscle spasm: Secondary | ICD-10-CM

## 2019-10-31 DIAGNOSIS — M542 Cervicalgia: Secondary | ICD-10-CM | POA: Diagnosis not present

## 2019-10-31 DIAGNOSIS — M5412 Radiculopathy, cervical region: Secondary | ICD-10-CM
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
## 2019-11-05 ENCOUNTER — Ambulatory Visit: Payer: BC Managed Care – PPO | Admitting: Physical Therapy

## 2019-11-06 ENCOUNTER — Encounter: Payer: Self-pay | Admitting: Physical Medicine & Rehabilitation

## 2019-11-06 ENCOUNTER — Other Ambulatory Visit: Payer: Self-pay

## 2019-11-06 ENCOUNTER — Encounter (HOSPITAL_BASED_OUTPATIENT_CLINIC_OR_DEPARTMENT_OTHER): Payer: BC Managed Care – PPO | Admitting: Physical Medicine & Rehabilitation

## 2019-11-06 VITALS — BP 117/69 | HR 79 | Temp 98.9°F | Ht 65.0 in | Wt 151.0 lb

## 2019-11-06 DIAGNOSIS — M7541 Impingement syndrome of right shoulder: Secondary | ICD-10-CM | POA: Diagnosis not present

## 2019-11-06 DIAGNOSIS — M47812 Spondylosis without myelopathy or radiculopathy, cervical region: Secondary | ICD-10-CM | POA: Diagnosis not present

## 2019-11-06 NOTE — Progress Notes (Signed)
CC: Acupuncture visit #3 Neck pain with hand tingling  Bilateral shoulder pain   Acupuncture with E stim 25 min total  Bilateral GB 20 Right BL 10 Right BL 10' (1.5 kun lateral to C5 spinous process Bilateral  BL 11 RIght GB 21 RIght S I 13  Electrical stimulation between right GB 21 and rightSI 13 Electrical stimulation between cross needles of left GB 20 to Left BL 11, Right GB 20 to Right Bl 10, Right BL 10' to RIght BL 11  Patient tolerated procedure well post procedure instructions given Repeat acupuncture tx 1 more time then reassess efficacy

## 2019-11-07 ENCOUNTER — Encounter (HOSPITAL_BASED_OUTPATIENT_CLINIC_OR_DEPARTMENT_OTHER): Payer: BC Managed Care – PPO | Admitting: Physical Medicine and Rehabilitation

## 2019-11-07 ENCOUNTER — Encounter: Payer: Self-pay | Admitting: Physical Medicine and Rehabilitation

## 2019-11-07 VITALS — BP 120/83 | HR 76 | Temp 98.8°F | Ht 65.0 in | Wt 153.4 lb

## 2019-11-07 DIAGNOSIS — M7918 Myalgia, other site: Secondary | ICD-10-CM

## 2019-11-07 DIAGNOSIS — M7541 Impingement syndrome of right shoulder: Secondary | ICD-10-CM | POA: Diagnosis not present

## 2019-11-07 NOTE — Progress Notes (Signed)
Trigger Point Injection  Indication: Cervical myofascial pain not relieved by medication management and other conservative care.  Informed consent was obtained after describing risk and benefits of the procedure with the patient, this includes bleeding, bruising, infection and medication side effects.  The patient wishes to proceed and has given written consent.  The patient was placed in a seated position.  The cervical area was marked and prepped with Betadine.  It was entered with a 25-gauge 1/2 inch needle and a total of 5 mL of 1% lidocaine was injected into a total of 4 trigger points, after negative draw back for blood.  The patient tolerated the procedure well.  Post procedure instructions were given. 

## 2019-11-12 ENCOUNTER — Encounter: Payer: Self-pay | Admitting: Physical Therapy

## 2019-11-12 ENCOUNTER — Other Ambulatory Visit: Payer: Self-pay

## 2019-11-12 ENCOUNTER — Ambulatory Visit: Payer: BC Managed Care – PPO | Admitting: Physical Therapy

## 2019-11-12 DIAGNOSIS — M542 Cervicalgia: Secondary | ICD-10-CM | POA: Diagnosis not present

## 2019-11-12 DIAGNOSIS — M5412 Radiculopathy, cervical region: Secondary | ICD-10-CM

## 2019-11-12 DIAGNOSIS — R293 Abnormal posture: Secondary | ICD-10-CM

## 2019-11-12 DIAGNOSIS — M6281 Muscle weakness (generalized): Secondary | ICD-10-CM

## 2019-11-12 DIAGNOSIS — M62838 Other muscle spasm: Secondary | ICD-10-CM
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
## 2019-11-12 NOTE — Patient Instructions (Signed)
    Home exercise program created by Cinderella Christoffersen, PT.  For questions, please contact Kaleyah Labreck via phone at [phone] or email at [email or web address]  [facility] [place] [address]  [address] [place], [place], [address] Phone: [phone]   Fax:  [phone]    

## 2019-11-13 ENCOUNTER — Encounter: Payer: BC Managed Care – PPO | Admitting: Physical Medicine & Rehabilitation

## 2019-11-14 ENCOUNTER — Encounter: Payer: BC Managed Care – PPO | Admitting: Physical Medicine & Rehabilitation

## 2019-11-14 ENCOUNTER — Other Ambulatory Visit: Payer: Self-pay

## 2019-11-14 ENCOUNTER — Ambulatory Visit: Payer: BC Managed Care – PPO

## 2019-11-14 DIAGNOSIS — M542 Cervicalgia: Secondary | ICD-10-CM

## 2019-11-14 DIAGNOSIS — M62838 Other muscle spasm: Secondary | ICD-10-CM

## 2019-11-14 DIAGNOSIS — M5412 Radiculopathy, cervical region: Secondary | ICD-10-CM

## 2019-11-14 DIAGNOSIS — M6281 Muscle weakness (generalized): Secondary | ICD-10-CM

## 2019-11-14 DIAGNOSIS — R293 Abnormal posture: Secondary | ICD-10-CM
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
## 2019-11-18 ENCOUNTER — Ambulatory Visit: Payer: BC Managed Care – PPO | Admitting: Physical Therapy

## 2019-11-18 ENCOUNTER — Other Ambulatory Visit: Payer: Self-pay

## 2019-11-18 ENCOUNTER — Encounter: Payer: Self-pay | Admitting: Physical Therapy

## 2019-11-18 DIAGNOSIS — M5412 Radiculopathy, cervical region: Secondary | ICD-10-CM

## 2019-11-18 DIAGNOSIS — M542 Cervicalgia: Secondary | ICD-10-CM | POA: Diagnosis not present

## 2019-11-18 DIAGNOSIS — M62838 Other muscle spasm: Secondary | ICD-10-CM

## 2019-11-18 DIAGNOSIS — M6281 Muscle weakness (generalized): Secondary | ICD-10-CM

## 2019-11-18 DIAGNOSIS — R293 Abnormal posture: Secondary | ICD-10-CM
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
## 2019-11-20 ENCOUNTER — Encounter: Payer: BC Managed Care – PPO | Admitting: Physical Medicine & Rehabilitation

## 2019-11-21 ENCOUNTER — Ambulatory Visit: Payer: BC Managed Care – PPO | Admitting: Physical Therapy

## 2019-11-26 ENCOUNTER — Ambulatory Visit: Payer: BC Managed Care – PPO | Attending: Physical Medicine and Rehabilitation | Admitting: Physical Therapy

## 2019-11-26 DIAGNOSIS — R293 Abnormal posture: Secondary | ICD-10-CM | POA: Insufficient documentation

## 2019-11-26 DIAGNOSIS — M5412 Radiculopathy, cervical region: Secondary | ICD-10-CM | POA: Insufficient documentation

## 2019-11-26 DIAGNOSIS — M542 Cervicalgia: Secondary | ICD-10-CM | POA: Insufficient documentation

## 2019-11-26 DIAGNOSIS — M6281 Muscle weakness (generalized): Secondary | ICD-10-CM | POA: Insufficient documentation

## 2019-11-26 DIAGNOSIS — M62838 Other muscle spasm: Secondary | ICD-10-CM | POA: Insufficient documentation

## 2019-11-27 ENCOUNTER — Encounter
Payer: BC Managed Care – PPO | Attending: Physical Medicine and Rehabilitation | Admitting: Physical Medicine & Rehabilitation

## 2019-11-27 ENCOUNTER — Encounter: Payer: Self-pay | Admitting: Physical Medicine & Rehabilitation

## 2019-11-27 ENCOUNTER — Other Ambulatory Visit: Payer: Self-pay

## 2019-11-27 DIAGNOSIS — M5010 Cervical disc disorder with radiculopathy, unspecified cervical region: Secondary | ICD-10-CM | POA: Diagnosis present

## 2019-11-27 DIAGNOSIS — M47812 Spondylosis without myelopathy or radiculopathy, cervical region: Secondary | ICD-10-CM | POA: Insufficient documentation

## 2019-11-27 DIAGNOSIS — M7918 Myalgia, other site: Secondary | ICD-10-CM | POA: Diagnosis present

## 2019-11-27 DIAGNOSIS — M4802 Spinal stenosis, cervical region: Secondary | ICD-10-CM | POA: Insufficient documentation

## 2019-11-27 DIAGNOSIS — M7541 Impingement syndrome of right shoulder: Secondary | ICD-10-CM | POA: Insufficient documentation

## 2019-11-27 NOTE — Patient Instructions (Signed)
Acupuncture Acupuncture is a type of treatment that involves stimulating specific points on your body by inserting thin needles through your skin. Acupuncture is often used to treat pain, but it may also be used to help relieve other types of symptoms. Your health care provider may recommend acupuncture to help treat various conditions, such as:  Migraine headaches.  Tension headaches.  Arthritis pain.  Addiction.  Chronic pain.  Nausea and vomiting after a surgery.  High blood pressure (hypertension).  Chronic obstructive pulmonary disease (COPD).  Nausea caused by cancer treatment.  Sudden or severe (acute) pain. Acupuncture is based on traditional Chinese medicine, which recognizes more than 2,000 points on the body that connect energy pathways (meridians) through the body. The goal in stimulating these points is to balance the physical, emotional, and mental energy in your body. Acupuncture is done by a health care provider who has specialized training (licensed acupuncture practitioner). Treatment often requires several acupuncture sessions. You may have acupuncture along with other medical treatments. Tell a health care provider about:  Any allergies you have.  All medicines you are taking, including vitamins, herbs, eye drops, creams, and over-the-counter medicines.  Any blood disorders you have.  Any surgeries you have had.  Any medical conditions you have.  Whether you are pregnant or may be pregnant. What are the risks? Generally, this is a safe treatment. However, problems may occur, including:  Skin infection.  Damage to organs or structures that are under the skin where a needle is placed. What happens before the treatment?  Your acupuncture practitioner will ask about your medical history and your symptoms.  You may have a physical exam. What happens during the treatment? The exact procedure will depend on your condition and how your acupuncture provider  treats it. In general:  Your skin will be cleaned with a germ-killing (antiseptic) solution.  Your acupuncture practitioner will open a new set of germ-free (sterile) needles.  The needles will be gently inserted into your skin. They will be left in place for a certain amount of time. You may feel slight pain or a tingling sensation.  Your acupuncture practitioner may: ? Apply electrical energy to the needles. ? Adjust the needles in certain ways.  After your procedure, the acupuncture practitioner will remove the needles, throw them away, and clean your skin. The procedure may vary among health care providers. What can I expect after the treatment? People react differently to acupuncture. Make sure you ask your acupuncture provider what to expect after your treatment. It is common to have:  Minor bruising.  Mild pain.  A small amount of bleeding. Follow these instructions at home:  Follow any instructions given by your provider after the treatment.  Keep all follow-up visits as told by your health care provider. This is important. Contact a health care provider if:  You have questions about your reaction to the treatment.  You have soreness.  You have skin irritation or redness.  You have a fever. Summary  Acupuncture is a type of treatment that involves stimulating specific points on your body by inserting thin needles through your skin.  This treatment is often used to treat pain, but it may also be used to help relieve other types of symptoms.  The exact procedure will depend on your condition and how your acupuncture provider treats it. This information is not intended to replace advice given to you by your health care provider. Make sure you discuss any questions you have with your health   care provider. Document Revised: 02/16/2017 Document Reviewed: 12/01/2016 Elsevier Patient Education  2020 [employer]  

## 2019-11-27 NOTE — Progress Notes (Signed)
CC: Acupuncture visit #4 Neck pain with hand tingling  Bilateral shoulder pain   Acupuncture with E stim 25 min total   Right GB 21 right SI 12 bilateral BL 11, GV 14  Electrical stimulation between right SI 12 and right GB 21 as well as between bilateral 11 4 Hz stimulation  Patient tolerated procedure well post procedure instructions given Patient to follow-up with Dr. Carlis Abbott to assess efficacy of acupuncture treatments in conjunction with PT trigger points

## 2019-12-05 ENCOUNTER — Other Ambulatory Visit: Payer: Self-pay | Admitting: Physical Medicine and Rehabilitation

## 2019-12-05 ENCOUNTER — Ambulatory Visit: Payer: BC Managed Care – PPO

## 2019-12-05 MED ORDER — METHOCARBAMOL 500 MG PO TABS: 500.0000 mg | ORAL_TABLET | Freq: Four times a day (QID) | ORAL | 0 refills | Status: DC | PRN

## 2019-12-09 ENCOUNTER — Other Ambulatory Visit: Payer: Self-pay

## 2019-12-10 ENCOUNTER — Ambulatory Visit: Payer: BC Managed Care – PPO | Admitting: Physical Therapy

## 2019-12-10 ENCOUNTER — Encounter: Payer: Self-pay | Admitting: Physical Medicine and Rehabilitation

## 2019-12-10 ENCOUNTER — Encounter (HOSPITAL_BASED_OUTPATIENT_CLINIC_OR_DEPARTMENT_OTHER): Payer: BC Managed Care – PPO | Admitting: Physical Medicine and Rehabilitation

## 2019-12-10 ENCOUNTER — Encounter: Payer: Self-pay | Admitting: Physical Therapy

## 2019-12-10 ENCOUNTER — Other Ambulatory Visit: Payer: Self-pay

## 2019-12-10 VITALS — BP 118/60 | HR 71 | Temp 98.5°F | Ht 65.0 in | Wt 149.0 lb

## 2019-12-10 DIAGNOSIS — M6281 Muscle weakness (generalized): Secondary | ICD-10-CM

## 2019-12-10 DIAGNOSIS — M542 Cervicalgia: Secondary | ICD-10-CM | POA: Diagnosis present

## 2019-12-10 DIAGNOSIS — M5412 Radiculopathy, cervical region: Secondary | ICD-10-CM

## 2019-12-10 DIAGNOSIS — M4802 Spinal stenosis, cervical region: Secondary | ICD-10-CM | POA: Diagnosis not present

## 2019-12-10 DIAGNOSIS — M7918 Myalgia, other site: Secondary | ICD-10-CM | POA: Diagnosis not present

## 2019-12-10 DIAGNOSIS — M47812 Spondylosis without myelopathy or radiculopathy, cervical region: Secondary | ICD-10-CM | POA: Diagnosis not present

## 2019-12-10 DIAGNOSIS — R293 Abnormal posture: Secondary | ICD-10-CM | POA: Diagnosis present

## 2019-12-10 DIAGNOSIS — M62838 Other muscle spasm: Secondary | ICD-10-CM | POA: Diagnosis present

## 2019-12-10 NOTE — Progress Notes (Signed)
Trigger Point Injection  Indication: Cervical myofascial pain not relieved by medication management and other conservative care.  Informed consent was obtained after describing risk and benefits of the procedure with the patient, this includes bleeding, bruising, infection and medication side effects.  The patient wishes to proceed and has given written consent.  The patient was placed in a seated position.  The cervical area was marked and prepped with Betadine.  It was entered with a 25-gauge 1/2 inch needle and a total of 4 mL of 1% lidocaine and 0.5% marcaine was injected into a total of 4 trigger points, after negative draw back for blood.  The patient tolerated the procedure well.  Post procedure instructions were given.
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
## 2019-12-12 ENCOUNTER — Ambulatory Visit: Payer: BC Managed Care – PPO

## 2019-12-12 ENCOUNTER — Other Ambulatory Visit: Payer: Self-pay

## 2019-12-12 DIAGNOSIS — R293 Abnormal posture: Secondary | ICD-10-CM

## 2019-12-12 DIAGNOSIS — M6281 Muscle weakness (generalized): Secondary | ICD-10-CM

## 2019-12-12 DIAGNOSIS — M542 Cervicalgia: Secondary | ICD-10-CM

## 2019-12-12 DIAGNOSIS — M62838 Other muscle spasm: Secondary | ICD-10-CM

## 2019-12-12 DIAGNOSIS — M5412 Radiculopathy, cervical region: Secondary | ICD-10-CM
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
## 2019-12-17 ENCOUNTER — Ambulatory Visit: Payer: BC Managed Care – PPO

## 2019-12-17 ENCOUNTER — Other Ambulatory Visit: Payer: Self-pay

## 2019-12-17 DIAGNOSIS — M5412 Radiculopathy, cervical region: Secondary | ICD-10-CM

## 2019-12-17 DIAGNOSIS — M542 Cervicalgia: Secondary | ICD-10-CM | POA: Diagnosis not present

## 2019-12-17 DIAGNOSIS — R293 Abnormal posture: Secondary | ICD-10-CM

## 2019-12-17 DIAGNOSIS — M6281 Muscle weakness (generalized): Secondary | ICD-10-CM

## 2019-12-17 DIAGNOSIS — M62838 Other muscle spasm: Secondary | ICD-10-CM
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
## 2019-12-19 ENCOUNTER — Other Ambulatory Visit: Payer: Self-pay

## 2019-12-19 ENCOUNTER — Ambulatory Visit: Payer: BC Managed Care – PPO | Attending: Physical Medicine and Rehabilitation | Admitting: Physical Therapy

## 2019-12-19 ENCOUNTER — Encounter: Payer: Self-pay | Admitting: Physical Therapy

## 2019-12-19 DIAGNOSIS — R293 Abnormal posture: Secondary | ICD-10-CM | POA: Diagnosis present

## 2019-12-19 DIAGNOSIS — M5412 Radiculopathy, cervical region: Secondary | ICD-10-CM | POA: Insufficient documentation

## 2019-12-19 DIAGNOSIS — M542 Cervicalgia: Secondary | ICD-10-CM | POA: Insufficient documentation

## 2019-12-19 DIAGNOSIS — M6281 Muscle weakness (generalized): Secondary | ICD-10-CM | POA: Diagnosis present

## 2019-12-19 DIAGNOSIS — M62838 Other muscle spasm: Secondary | ICD-10-CM | POA: Diagnosis present
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
## 2019-12-24 ENCOUNTER — Ambulatory Visit: Payer: BC Managed Care – PPO | Admitting: Physical Therapy

## 2019-12-25 ENCOUNTER — Encounter: Payer: Self-pay | Admitting: Physical Medicine and Rehabilitation

## 2019-12-26 ENCOUNTER — Encounter: Payer: BC Managed Care – PPO | Admitting: Physical Therapy

## 2019-12-31 ENCOUNTER — Encounter: Payer: Self-pay | Admitting: Physical Therapy

## 2019-12-31 ENCOUNTER — Other Ambulatory Visit: Payer: Self-pay

## 2019-12-31 ENCOUNTER — Ambulatory Visit: Payer: BC Managed Care – PPO | Admitting: Physical Therapy

## 2019-12-31 DIAGNOSIS — M542 Cervicalgia: Secondary | ICD-10-CM | POA: Diagnosis not present

## 2019-12-31 DIAGNOSIS — M6281 Muscle weakness (generalized): Secondary | ICD-10-CM

## 2019-12-31 DIAGNOSIS — M5412 Radiculopathy, cervical region: Secondary | ICD-10-CM

## 2019-12-31 DIAGNOSIS — M62838 Other muscle spasm: Secondary | ICD-10-CM

## 2019-12-31 DIAGNOSIS — R293 Abnormal posture: Secondary | ICD-10-CM
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
## 2020-01-02 ENCOUNTER — Other Ambulatory Visit: Payer: Self-pay

## 2020-01-02 ENCOUNTER — Ambulatory Visit: Payer: BC Managed Care – PPO | Admitting: Physical Therapy

## 2020-01-02 ENCOUNTER — Encounter: Payer: Self-pay | Admitting: Physical Therapy

## 2020-01-02 DIAGNOSIS — M542 Cervicalgia: Secondary | ICD-10-CM | POA: Diagnosis not present

## 2020-01-02 DIAGNOSIS — M5412 Radiculopathy, cervical region: Secondary | ICD-10-CM

## 2020-01-02 DIAGNOSIS — R293 Abnormal posture: Secondary | ICD-10-CM

## 2020-01-02 DIAGNOSIS — M6281 Muscle weakness (generalized): Secondary | ICD-10-CM

## 2020-01-02 DIAGNOSIS — M62838 Other muscle spasm: Secondary | ICD-10-CM
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
## 2020-01-05 ENCOUNTER — Ambulatory Visit: Payer: BC Managed Care – PPO | Admitting: Physical Medicine and Rehabilitation

## 2020-01-06 ENCOUNTER — Telehealth: Payer: Self-pay | Admitting: Physical Medicine and Rehabilitation

## 2020-01-06 NOTE — Telephone Encounter (Signed)
Called pt and resch appt. 11/8

## 2020-01-06 NOTE — Telephone Encounter (Signed)
Patient called needing to reschedule her appointment. The number to contact patient is [phone]

## 2020-01-07 ENCOUNTER — Ambulatory Visit: Payer: BC Managed Care – PPO | Admitting: Physical Therapy

## 2020-01-09 ENCOUNTER — Other Ambulatory Visit: Payer: Self-pay

## 2020-01-09 ENCOUNTER — Encounter: Payer: Self-pay | Admitting: Physical Therapy

## 2020-01-09 ENCOUNTER — Ambulatory Visit: Payer: BC Managed Care – PPO | Admitting: Physical Therapy

## 2020-01-09 DIAGNOSIS — M542 Cervicalgia: Secondary | ICD-10-CM

## 2020-01-09 DIAGNOSIS — M62838 Other muscle spasm: Secondary | ICD-10-CM

## 2020-01-09 DIAGNOSIS — R293 Abnormal posture: Secondary | ICD-10-CM

## 2020-01-09 DIAGNOSIS — M5412 Radiculopathy, cervical region: Secondary | ICD-10-CM

## 2020-01-09 DIAGNOSIS — M6281 Muscle weakness (generalized): Secondary | ICD-10-CM
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
## 2020-01-14 ENCOUNTER — Ambulatory Visit: Payer: BC Managed Care – PPO

## 2020-01-15 ENCOUNTER — Encounter
Payer: BC Managed Care – PPO | Attending: Physical Medicine and Rehabilitation | Admitting: Physical Medicine and Rehabilitation

## 2020-01-15 ENCOUNTER — Ambulatory Visit: Payer: BC Managed Care – PPO | Admitting: Physical Medicine and Rehabilitation

## 2020-01-15 DIAGNOSIS — M7541 Impingement syndrome of right shoulder: Secondary | ICD-10-CM | POA: Insufficient documentation

## 2020-01-15 DIAGNOSIS — M7918 Myalgia, other site: Secondary | ICD-10-CM | POA: Insufficient documentation

## 2020-01-15 DIAGNOSIS — M5010 Cervical disc disorder with radiculopathy, unspecified cervical region: Secondary | ICD-10-CM | POA: Insufficient documentation

## 2020-01-15 DIAGNOSIS — M4802 Spinal stenosis, cervical region: Secondary | ICD-10-CM | POA: Insufficient documentation

## 2020-01-15 DIAGNOSIS — M47812 Spondylosis without myelopathy or radiculopathy, cervical region: Secondary | ICD-10-CM | POA: Insufficient documentation

## 2020-01-16 ENCOUNTER — Ambulatory Visit: Payer: BC Managed Care – PPO | Admitting: Physical Therapy

## 2020-01-16 ENCOUNTER — Other Ambulatory Visit: Payer: Self-pay

## 2020-01-16 ENCOUNTER — Encounter: Payer: Self-pay | Admitting: Physical Therapy

## 2020-01-16 DIAGNOSIS — M542 Cervicalgia: Secondary | ICD-10-CM

## 2020-01-16 DIAGNOSIS — M5412 Radiculopathy, cervical region: Secondary | ICD-10-CM

## 2020-01-16 DIAGNOSIS — M6281 Muscle weakness (generalized): Secondary | ICD-10-CM

## 2020-01-16 DIAGNOSIS — R293 Abnormal posture: Secondary | ICD-10-CM

## 2020-01-16 DIAGNOSIS — M62838 Other muscle spasm: Secondary | ICD-10-CM
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
## 2020-01-20 ENCOUNTER — Other Ambulatory Visit: Payer: Self-pay

## 2020-01-20 ENCOUNTER — Ambulatory Visit: Payer: BC Managed Care – PPO | Attending: Physical Medicine and Rehabilitation

## 2020-01-20 DIAGNOSIS — R293 Abnormal posture: Secondary | ICD-10-CM

## 2020-01-20 DIAGNOSIS — M542 Cervicalgia: Secondary | ICD-10-CM | POA: Insufficient documentation

## 2020-01-20 DIAGNOSIS — M6281 Muscle weakness (generalized): Secondary | ICD-10-CM | POA: Diagnosis present

## 2020-01-20 DIAGNOSIS — M5412 Radiculopathy, cervical region: Secondary | ICD-10-CM | POA: Insufficient documentation

## 2020-01-20 DIAGNOSIS — M62838 Other muscle spasm: Secondary | ICD-10-CM
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
## 2020-01-21 ENCOUNTER — Telehealth: Payer: Self-pay | Admitting: Physical Medicine and Rehabilitation

## 2020-01-21 ENCOUNTER — Ambulatory Visit: Payer: BC Managed Care – PPO

## 2020-01-21 NOTE — Telephone Encounter (Signed)
Jennifer Roth called to make a follow up visit with Dr. Carlis Abbott because she missed her last appointment.  I did explain to her that she has 2 outstanding no shows with [place] and she would need to pay those at her next visit ($100).  She wanted to wait because she didn't have the money to pay that right now.  Jennifer Roth will need to make sure that is taken care of and it needs to be documented on her next appt.

## 2020-01-22 ENCOUNTER — Other Ambulatory Visit: Payer: Self-pay

## 2020-01-22 ENCOUNTER — Ambulatory Visit: Payer: BC Managed Care – PPO | Admitting: Physical Therapy

## 2020-01-22 ENCOUNTER — Encounter: Payer: Self-pay | Admitting: Physical Therapy

## 2020-01-22 DIAGNOSIS — M62838 Other muscle spasm: Secondary | ICD-10-CM

## 2020-01-22 DIAGNOSIS — R293 Abnormal posture: Secondary | ICD-10-CM

## 2020-01-22 DIAGNOSIS — M542 Cervicalgia: Secondary | ICD-10-CM

## 2020-01-22 DIAGNOSIS — M5412 Radiculopathy, cervical region: Secondary | ICD-10-CM

## 2020-01-22 DIAGNOSIS — M6281 Muscle weakness (generalized): Secondary | ICD-10-CM

## 2020-01-22 NOTE — Patient Instructions (Signed)
    Home exercise program created by Zaevion Parke, PT.  For questions, please contact Sabah Zucco via phone at [phone] or email at [email or web address]  [facility] [place] [address]  [address] [place], [place], [address] Phone: [phone]   Fax:  [phone]    
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
## 2020-01-26 ENCOUNTER — Ambulatory Visit: Payer: Self-pay

## 2020-01-26 ENCOUNTER — Other Ambulatory Visit: Payer: Self-pay

## 2020-01-26 ENCOUNTER — Ambulatory Visit (INDEPENDENT_AMBULATORY_CARE_PROVIDER_SITE_OTHER): Payer: BC Managed Care – PPO | Admitting: Physical Medicine and Rehabilitation

## 2020-01-26 ENCOUNTER — Encounter: Payer: Self-pay | Admitting: Physical Medicine and Rehabilitation

## 2020-01-26 VITALS — BP 121/81 | HR 88

## 2020-01-26 DIAGNOSIS — M47812 Spondylosis without myelopathy or radiculopathy, cervical region: Secondary | ICD-10-CM | POA: Diagnosis not present

## 2020-01-26 DIAGNOSIS — M542 Cervicalgia: Secondary | ICD-10-CM | POA: Diagnosis not present

## 2020-01-26 MED ORDER — METHYLPREDNISOLONE ACETATE 80 MG/ML IJ SUSP
80.0000 mg | Freq: Once | INTRAMUSCULAR | Status: AC
  Administered 2020-01-26: 80 mg

## 2020-01-26 NOTE — Progress Notes (Signed)
Pt state neck and both shoulder pain that travels to her mid back.. Pt state when trying to do her hair or typing at a computer makes the pain worse. Pt state she takes pain meds and creams while using heating pads a help ease the pain.  Numeric Pain Rating Scale and Functional Assessment Average Pain 5   In the last MONTH (on 0-10 scale) has pain interfered with the following?  1. General activity like being  able to carry out your everyday physical activities such as walking, climbing stairs, carrying groceries, or moving a chair?  Rating(8)   -Driver, -BT, -Dye Allergies. Pt has agree to stay till five o'clock to drive herself home.

## 2020-01-26 NOTE — Procedures (Signed)
Cervical Facet Joint Intra-Articular Injection with Fluoroscopic Guidance  Patient: Jennifer Roth      Date of Birth: [birth year] MRN: [id number] PCP: Verlon Au, MD      Visit Date: 01/26/2020   Universal Protocol:    Date/Time: 11/08/217:12 PM  Consent Given By: the patient  Position: PRONE  Additional Comments: Vital signs were monitored before and after the procedure. Patient was prepped and draped in the usual sterile fashion. The correct patient, procedure, and site was verified.   Injection Procedure Details:  Procedure Site One Meds Administered:  Meds ordered this encounter  Medications  . methylPREDNISolone acetate (DEPO-MEDROL) injection 80 mg     Laterality: Bilateral  Location/Site:  C5-6  Needle size: 25 G  Needle type: Spinal  Needle Placement: Articular  Findings:  -Contrast Used: 0.5 mL iohexol 180 mg iodine/mL   -Comments: Excellent flow contrast around the joint along the articular pillars laterally.  Procedure Details: The region overlying the facet joints mentioned above were localized under fluoroscopic visualization. The needle was inserted down to the level of the lateral mass of the superior articular process of the facet joint to be injected. Then, the needle was "walked off" inferiorly into the lateral aspect of the facet joint. Bi-planar images were used for confirming placement and spot radiographs were documented.  A 0.25 ml volume of Omnipaque-240 was injected into the facet joint and a standard partial arthrogram was obtained. Radiographs were obtained of the arthrogram. A 0.5 ml. volume of the steroid/anesthetic solution was injected into the joint. This procedure was repeated for each facet joint injected.   Additional Comments:  The patient tolerated the procedure well Dressing: Band-Aid    Post-procedure details: Patient was observed during the procedure. Post-procedure instructions were reviewed.  Patient left the  clinic in stable condition.

## 2020-01-26 NOTE — Progress Notes (Signed)
Jennifer Roth - 44 y.o. female MRN [id number]  Date of birth: [birth year]  Office Visit Note: Visit Date: 01/26/2020 PCP: Verlon Au, MD Referred by: Verlon Au, MD  Subjective: Chief Complaint  Patient presents with  . Neck - Pain  . Right Shoulder - Pain  . Left Shoulder - Pain  . Middle Back - Pain   HPI:  Jennifer Roth is a 44 y.o. female who comes in today at the request of Sula Soda, MD for planned Bilateral C5-6 Cervical facet/medial branch block with fluoroscopic guidance.  The patient has failed conservative care including home exercise, medications, time and activity modification.  This injection will be diagnostic and hopefully therapeutic.  Please see requesting physician notes for further details and justification.  Exam has shown concordant pain with facet joint loading.  I saw the patient earlier this year he completed right-sided facet joint block at C5-6 with MRI findings of facet edema at C5-6.  At least to the report of the MRI there is no frank nerve compression or high-grade central stenosis.  She gets pain in the neck and both shoulders to the mid back.  She has had physical therapy as well as acupuncture and trigger point injections.  Facet joint block she received she cannot remember if it helped that much or not.  Today we completed bilateral C5-6 facet joint/medial branch block.  If she really required C6 transforaminal injections I would asked that she probably be referred to Dr. Romero Belling or Dr. Sheran Luz as they likely do more transforaminal injections than I do.  She likely would need this done under light sedation as she did well but did have a lot of movement during needling in the muscle.  ROS Otherwise per HPI.  Assessment & Plan: Visit Diagnoses:  1. Cervical spondylosis without myelopathy   2. Cervicalgia     Plan: No additional findings.   Meds & Orders:  Meds ordered this encounter  Medications  .  methylPREDNISolone acetate (DEPO-MEDROL) injection 80 mg    Orders Placed This Encounter  Procedures  . Facet Injection  . XR C-ARM NO REPORT    Follow-up: Return for visit to requesting physician as needed.   Procedures: No procedures performed  Cervical Facet Joint Intra-Articular Injection with Fluoroscopic Guidance  Patient: Jennifer Roth      Date of Birth: [birth year] MRN: [id number] PCP: Verlon Au, MD      Visit Date: 01/26/2020   Universal Protocol:    Date/Time: 11/08/217:12 PM  Consent Given By: the patient  Position: PRONE  Additional Comments: Vital signs were monitored before and after the procedure. Patient was prepped and draped in the usual sterile fashion. The correct patient, procedure, and site was verified.   Injection Procedure Details:  Procedure Site One Meds Administered:  Meds ordered this encounter  Medications  . methylPREDNISolone acetate (DEPO-MEDROL) injection 80 mg     Laterality: Bilateral  Location/Site:  C5-6  Needle size: 25 G  Needle type: Spinal  Needle Placement: Articular  Findings:  -Contrast Used: 0.5 mL iohexol 180 mg iodine/mL   -Comments: Excellent flow contrast around the joint along the articular pillars laterally.  Procedure Details: The region overlying the facet joints mentioned above were localized under fluoroscopic visualization. The needle was inserted down to the level of the lateral mass of the superior articular process of the facet joint to be injected. Then, the needle was "walked off" inferiorly into the lateral aspect of  the facet joint. Bi-planar images were used for confirming placement and spot radiographs were documented.  A 0.25 ml volume of Omnipaque-240 was injected into the facet joint and a standard partial arthrogram was obtained. Radiographs were obtained of the arthrogram. A 0.5 ml. volume of the steroid/anesthetic solution was injected into the joint. This procedure was repeated  for each facet joint injected.   Additional Comments:  The patient tolerated the procedure well Dressing: Band-Aid    Post-procedure details: Patient was observed during the procedure. Post-procedure instructions were reviewed.  Patient left the clinic in stable condition.    Clinical History: Interface, Rad Results In - 03/11/2019  8:27 AM EST CLINICAL DATA:  Neck pain and right cervical radiculopathy  EXAM: MRI CERVICAL SPINE WITHOUT CONTRAST  TECHNIQUE: Multiplanar, multisequence MR imaging of the cervical spine was performed. No intravenous contrast was administered.  COMPARISON:  None.  FINDINGS: Alignment: Nonspecific mild reversal of the cervical lordosis, which may reflect muscle spasm. Anteroposterior alignment is maintained.  Vertebrae: Vertebral body heights are preserved. There is minor marrow edema associated with the right C5 facet likely on a degenerative basis. Otherwise no marrow edema. No suspicious osseous lesion.  Cord: Normal caliber and signal.  Posterior Fossa, vertebral arteries, paraspinal tissues: Unremarkable.  Disc levels:  C2-C3:  No canal or foraminal stenosis.  C3-C4:  Minimal disc bulge.  No canal or foraminal stenosis.  C4-C5: Minimal disc bulge with superimposed small left foraminal protrusion and small endplate osteophytes. No canal or right foraminal stenosis. Mild left foraminal stenosis.  C5-C6:  No canal or foraminal stenosis.  C6-C7:  No canal or foraminal stenosis  C7-T1:  No canal or foraminal stenosis.  IMPRESSION: Mild degenerative changes as detailed above without high-grade stenosis. 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   Electronically Signed   By: Guadlupe Spanish M.D.   On: 03/11/2019 08:25     Objective:  VS:  HT:    WT:   BMI:     BP:121/81  HR:88bpm  TEMP: ( )  RESP:  Physical Exam Vitals and nursing note reviewed.   Constitutional:      General: She is not in acute distress.    Appearance: Normal appearance. She is not ill-appearing.  HENT:     Head: Normocephalic and atraumatic.     Right Ear: External ear normal.     Left Ear: External ear normal.  Eyes:     Extraocular Movements: Extraocular movements intact.  Cardiovascular:     Rate and Rhythm: Normal rate.     Pulses: Normal pulses.  Musculoskeletal:     Cervical back: Tenderness present. No rigidity.     Right lower leg: No edema.     Left lower leg: No edema.     Comments: Patient has good strength in the upper extremities including 5 out of 5 strength in wrist extension long finger flexion and APB.  There is no atrophy of the hands intrinsically.  There is a negative Hoffmann's test.   Lymphadenopathy:     Cervical: No cervical adenopathy.  Skin:    Findings: No erythema, lesion or rash.  Neurological:     General: No focal deficit present.     Mental Status: She is alert and oriented to person, place, and time.     Sensory: No sensory deficit.     Motor: No weakness or abnormal muscle tone.     Coordination: Coordination normal.  Psychiatric:        Mood and Affect: Mood normal.        Behavior: Behavior normal.      Imaging: XR C-ARM NO REPORT  Result Date: 01/26/2020 Please see Notes tab for imaging impression.

## 2020-01-31 ENCOUNTER — Encounter: Payer: Self-pay | Admitting: Physical Medicine and Rehabilitation

## 2020-02-03 ENCOUNTER — Other Ambulatory Visit: Payer: Self-pay

## 2020-02-03 ENCOUNTER — Telehealth: Payer: Self-pay | Admitting: Physical Medicine and Rehabilitation

## 2020-02-03 ENCOUNTER — Ambulatory Visit: Payer: BC Managed Care – PPO

## 2020-02-03 DIAGNOSIS — M62838 Other muscle spasm: Secondary | ICD-10-CM

## 2020-02-03 DIAGNOSIS — M542 Cervicalgia: Secondary | ICD-10-CM | POA: Diagnosis not present

## 2020-02-03 DIAGNOSIS — M6281 Muscle weakness (generalized): Secondary | ICD-10-CM

## 2020-02-03 DIAGNOSIS — R293 Abnormal posture: Secondary | ICD-10-CM

## 2020-02-03 DIAGNOSIS — M5412 Radiculopathy, cervical region: Secondary | ICD-10-CM

## 2020-02-03 NOTE — Telephone Encounter (Signed)
I have reviewed images, facet blocks not near nerve roots at all so any tingling twitching may be related to spine or myofascial pain. F/up Raulkar

## 2020-02-03 NOTE — Telephone Encounter (Signed)
Patient states that she was advised to call if she had any symptoms following her bilateral C5-6 facet injections on 11/8. She reports more tingling in her right arm as well as frequent twitching in the right arm. Please advise.

## 2020-02-03 NOTE — Telephone Encounter (Signed)
Called patient to advise  °
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
## 2020-02-06 ENCOUNTER — Ambulatory Visit: Payer: BC Managed Care – PPO | Admitting: Physical Therapy

## 2020-02-10 ENCOUNTER — Ambulatory Visit: Payer: BC Managed Care – PPO | Admitting: Physical Therapy

## 2020-02-10 ENCOUNTER — Other Ambulatory Visit: Payer: Self-pay

## 2020-02-10 ENCOUNTER — Encounter: Payer: Self-pay | Admitting: Physical Therapy

## 2020-02-10 DIAGNOSIS — M5412 Radiculopathy, cervical region: Secondary | ICD-10-CM

## 2020-02-10 DIAGNOSIS — M6281 Muscle weakness (generalized): Secondary | ICD-10-CM

## 2020-02-10 DIAGNOSIS — R293 Abnormal posture: Secondary | ICD-10-CM

## 2020-02-10 DIAGNOSIS — M62838 Other muscle spasm: Secondary | ICD-10-CM

## 2020-02-10 DIAGNOSIS — M542 Cervicalgia: Secondary | ICD-10-CM | POA: Diagnosis not present
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
## 2020-02-17 ENCOUNTER — Ambulatory Visit: Payer: BC Managed Care – PPO | Admitting: Physical Therapy

## 2020-02-17 ENCOUNTER — Encounter: Payer: Self-pay | Admitting: Physical Therapy

## 2020-02-17 ENCOUNTER — Other Ambulatory Visit: Payer: Self-pay

## 2020-02-17 DIAGNOSIS — M542 Cervicalgia: Secondary | ICD-10-CM | POA: Diagnosis not present

## 2020-02-17 DIAGNOSIS — M6281 Muscle weakness (generalized): Secondary | ICD-10-CM

## 2020-02-17 DIAGNOSIS — M62838 Other muscle spasm: Secondary | ICD-10-CM

## 2020-02-17 DIAGNOSIS — R293 Abnormal posture: Secondary | ICD-10-CM

## 2020-02-17 DIAGNOSIS — M5412 Radiculopathy, cervical region: Secondary | ICD-10-CM

## 2020-02-17 NOTE — Patient Instructions (Signed)
    Home exercise program created by Rody Keadle, PT.  For questions, please contact Thi Sisemore via phone at [phone] or email at [email or web address]  [facility] [place] [facility] [address]  [address] [place], [place], [address] Phone: [phone]   Fax:  [phone]    
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
## 2020-02-19 ENCOUNTER — Encounter: Payer: Self-pay | Admitting: Physical Medicine and Rehabilitation

## 2020-06-21 ENCOUNTER — Other Ambulatory Visit: Payer: Self-pay

## 2020-06-21 ENCOUNTER — Encounter: Payer: Self-pay | Admitting: Physical Medicine and Rehabilitation

## 2020-06-21 ENCOUNTER — Encounter
Payer: BC Managed Care – PPO | Attending: Physical Medicine and Rehabilitation | Admitting: Physical Medicine and Rehabilitation

## 2020-06-21 VITALS — BP 115/79 | HR 98 | Temp 98.9°F | Ht 65.0 in | Wt 154.0 lb

## 2020-06-21 DIAGNOSIS — M47812 Spondylosis without myelopathy or radiculopathy, cervical region: Secondary | ICD-10-CM | POA: Diagnosis not present

## 2020-06-21 DIAGNOSIS — M7918 Myalgia, other site: Secondary | ICD-10-CM | POA: Insufficient documentation

## 2020-06-21 DIAGNOSIS — K589 Irritable bowel syndrome without diarrhea: Secondary | ICD-10-CM | POA: Insufficient documentation

## 2020-06-21 NOTE — Patient Instructions (Signed)
Blue emu oil 
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
## 2020-06-23 NOTE — Addendum Note (Signed)
Addended by: Horton Chin on: 06/23/2020 08:27 PM   Modules accepted: Orders

## 2020-07-02 ENCOUNTER — Encounter: Payer: Self-pay | Admitting: Physical Medicine and Rehabilitation

## 2020-07-06 ENCOUNTER — Ambulatory Visit: Payer: BC Managed Care – PPO | Attending: Physical Medicine and Rehabilitation | Admitting: Physical Therapy

## 2020-07-06 ENCOUNTER — Encounter: Payer: Self-pay | Admitting: Physical Therapy

## 2020-07-06 ENCOUNTER — Other Ambulatory Visit: Payer: Self-pay

## 2020-07-06 DIAGNOSIS — R293 Abnormal posture: Secondary | ICD-10-CM | POA: Diagnosis present

## 2020-07-06 DIAGNOSIS — M6281 Muscle weakness (generalized): Secondary | ICD-10-CM | POA: Diagnosis present

## 2020-07-06 DIAGNOSIS — M62838 Other muscle spasm: Secondary | ICD-10-CM | POA: Insufficient documentation

## 2020-07-06 DIAGNOSIS — M542 Cervicalgia: Secondary | ICD-10-CM | POA: Diagnosis present

## 2020-07-06 DIAGNOSIS — M5412 Radiculopathy, cervical region: Secondary | ICD-10-CM | POA: Insufficient documentation
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
## 2020-07-13 ENCOUNTER — Ambulatory Visit: Payer: BC Managed Care – PPO | Admitting: Physical Therapy

## 2020-07-13 ENCOUNTER — Other Ambulatory Visit: Payer: Self-pay

## 2020-07-13 ENCOUNTER — Encounter: Payer: Self-pay | Admitting: Physical Therapy

## 2020-07-13 DIAGNOSIS — M5412 Radiculopathy, cervical region: Secondary | ICD-10-CM

## 2020-07-13 DIAGNOSIS — M542 Cervicalgia: Secondary | ICD-10-CM | POA: Diagnosis not present

## 2020-07-13 DIAGNOSIS — R293 Abnormal posture: Secondary | ICD-10-CM

## 2020-07-13 DIAGNOSIS — M6281 Muscle weakness (generalized): Secondary | ICD-10-CM

## 2020-07-13 DIAGNOSIS — M62838 Other muscle spasm: Secondary | ICD-10-CM
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
## 2020-07-15 ENCOUNTER — Ambulatory Visit: Payer: BC Managed Care – PPO | Admitting: Physical Therapy

## 2020-07-20 ENCOUNTER — Other Ambulatory Visit: Payer: Self-pay

## 2020-07-20 ENCOUNTER — Encounter: Payer: Self-pay | Admitting: Physical Therapy

## 2020-07-20 ENCOUNTER — Ambulatory Visit: Payer: BC Managed Care – PPO | Attending: Physical Medicine and Rehabilitation | Admitting: Physical Therapy

## 2020-07-20 DIAGNOSIS — M6281 Muscle weakness (generalized): Secondary | ICD-10-CM | POA: Insufficient documentation

## 2020-07-20 DIAGNOSIS — M5412 Radiculopathy, cervical region: Secondary | ICD-10-CM | POA: Diagnosis present

## 2020-07-20 DIAGNOSIS — M542 Cervicalgia: Secondary | ICD-10-CM | POA: Diagnosis not present

## 2020-07-20 DIAGNOSIS — M62838 Other muscle spasm: Secondary | ICD-10-CM | POA: Insufficient documentation

## 2020-07-20 DIAGNOSIS — R293 Abnormal posture: Secondary | ICD-10-CM | POA: Diagnosis present
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
## 2020-07-21 ENCOUNTER — Other Ambulatory Visit: Payer: Self-pay | Admitting: Physical Medicine and Rehabilitation

## 2020-07-21 DIAGNOSIS — M48062 Spinal stenosis, lumbar region with neurogenic claudication: Secondary | ICD-10-CM

## 2020-07-22 ENCOUNTER — Encounter: Payer: Self-pay | Admitting: Physical Therapy

## 2020-07-22 ENCOUNTER — Ambulatory Visit: Payer: BC Managed Care – PPO | Admitting: Physical Therapy

## 2020-07-22 ENCOUNTER — Other Ambulatory Visit: Payer: Self-pay

## 2020-07-22 DIAGNOSIS — R293 Abnormal posture: Secondary | ICD-10-CM

## 2020-07-22 DIAGNOSIS — M542 Cervicalgia: Secondary | ICD-10-CM

## 2020-07-22 DIAGNOSIS — M5412 Radiculopathy, cervical region: Secondary | ICD-10-CM

## 2020-07-22 DIAGNOSIS — M62838 Other muscle spasm: Secondary | ICD-10-CM

## 2020-07-22 DIAGNOSIS — M6281 Muscle weakness (generalized): Secondary | ICD-10-CM
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
## 2020-07-26 ENCOUNTER — Telehealth: Payer: Self-pay

## 2020-07-26 NOTE — Telephone Encounter (Signed)
Called pt and r/s 

## 2020-07-26 NOTE — Telephone Encounter (Signed)
Patient called she wants to reschedule her appointment call back:[phone]

## 2020-07-27 ENCOUNTER — Telehealth: Payer: Self-pay | Admitting: Physical Medicine and Rehabilitation

## 2020-07-27 ENCOUNTER — Ambulatory Visit: Payer: BC Managed Care – PPO | Admitting: Physical Therapy

## 2020-07-27 NOTE — Telephone Encounter (Signed)
Pt called needing to r/s her appt currently set for 07/28/20.   [phone]

## 2020-07-27 NOTE — Telephone Encounter (Signed)
Rescheduled

## 2020-07-28 ENCOUNTER — Ambulatory Visit: Payer: BC Managed Care – PPO | Admitting: Physical Medicine and Rehabilitation

## 2020-07-29 ENCOUNTER — Ambulatory Visit: Payer: BC Managed Care – PPO | Admitting: Physical Therapy

## 2020-07-30 ENCOUNTER — Encounter: Payer: BC Managed Care – PPO | Admitting: Physical Medicine and Rehabilitation

## 2020-07-30 VITALS — Ht 65.0 in

## 2020-07-30 NOTE — Progress Notes (Signed)
Trigger Point Injection  Indication: Cervical myofascial pain not relieved by medication management and other conservative care.  Informed consent was obtained after describing risk and benefits of the procedure with the patient, this includes bleeding, bruising, infection and medication side effects.  The patient wishes to proceed and has given written consent.  The patient was placed in a seated position.  The cervical area was marked and prepped with Betadine.  It was entered with a 25-gauge 1/2 inch needle and a total of 5 mL of 1% lidocaine was injected into a total of 3 trigger points, after negative draw back for blood.  The patient tolerated the procedure well.  Post procedure instructions were given. This encounter was created in error - please disregard.

## 2020-08-03 ENCOUNTER — Ambulatory Visit: Payer: BC Managed Care – PPO | Admitting: Physical Therapy

## 2020-08-05 ENCOUNTER — Encounter: Payer: Self-pay | Admitting: Physical Medicine and Rehabilitation

## 2020-08-05 ENCOUNTER — Encounter
Payer: BC Managed Care – PPO | Attending: Physical Medicine and Rehabilitation | Admitting: Physical Medicine and Rehabilitation

## 2020-08-05 ENCOUNTER — Other Ambulatory Visit: Payer: Self-pay

## 2020-08-05 ENCOUNTER — Ambulatory Visit: Payer: BC Managed Care – PPO | Admitting: Physical Therapy

## 2020-08-05 VITALS — BP 110/73 | HR 81 | Temp 98.7°F | Ht 65.0 in | Wt 152.0 lb

## 2020-08-05 DIAGNOSIS — M791 Myalgia, unspecified site: Secondary | ICD-10-CM | POA: Diagnosis not present

## 2020-08-05 NOTE — Progress Notes (Signed)
Trigger Point Injection  Indication: Cervical myofascial pain not relieved by medication management and other conservative care.  Informed consent was obtained after describing risk and benefits of the procedure with the patient, this includes bleeding, bruising, infection and medication side effects.  The patient wishes to proceed and has given written consent.  The patient was placed in a seated position.  The cervical area was marked and prepped with Betadine.  It was entered with a 25-gauge 1/2 inch needle and a total of 5 mL of 1% lidocaine and normal saline was injected into a total of 3 trigger points, after negative draw back for blood.  The patient tolerated the procedure well.  Post procedure instructions were given. 

## 2020-08-09 ENCOUNTER — Ambulatory Visit: Payer: BC Managed Care – PPO | Admitting: Physical Medicine and Rehabilitation

## 2020-08-09 ENCOUNTER — Telehealth: Payer: Self-pay

## 2020-08-09 NOTE — Telephone Encounter (Signed)
Called pt and r/s 

## 2020-08-09 NOTE — Telephone Encounter (Signed)
FYI pt called and would like to reschedule her apt for today.

## 2020-08-10 ENCOUNTER — Ambulatory Visit: Payer: BC Managed Care – PPO | Admitting: Physical Therapy

## 2020-08-11 ENCOUNTER — Ambulatory Visit: Payer: BC Managed Care – PPO | Admitting: Physical Medicine and Rehabilitation

## 2020-08-12 ENCOUNTER — Ambulatory Visit: Payer: BC Managed Care – PPO | Admitting: Physical Therapy

## 2020-08-17 ENCOUNTER — Telehealth: Payer: Self-pay | Admitting: Physical Medicine and Rehabilitation

## 2020-08-17 ENCOUNTER — Ambulatory Visit: Payer: BC Managed Care – PPO | Admitting: Physical Therapy

## 2020-08-17 NOTE — Telephone Encounter (Signed)
Pt called needing to resch her appt with Dr. Alvester Morin. The best phone number to reach her at is [phone].

## 2020-08-18 ENCOUNTER — Ambulatory Visit: Payer: BC Managed Care – PPO

## 2020-08-18 NOTE — Telephone Encounter (Signed)
Rescheduled

## 2020-08-19 ENCOUNTER — Ambulatory Visit: Payer: BC Managed Care – PPO | Admitting: Physical Medicine and Rehabilitation

## 2020-08-24 ENCOUNTER — Ambulatory Visit: Payer: BC Managed Care – PPO | Attending: Physical Medicine and Rehabilitation | Admitting: Physical Therapy

## 2020-08-25 ENCOUNTER — Telehealth: Payer: Self-pay | Admitting: Physical Medicine and Rehabilitation

## 2020-08-25 ENCOUNTER — Ambulatory Visit: Payer: BC Managed Care – PPO | Admitting: Physical Medicine and Rehabilitation

## 2020-08-25 NOTE — Telephone Encounter (Signed)
Pt called stating she needs to r/s her 3 pm appt set for today.   [phone]

## 2020-08-25 NOTE — Telephone Encounter (Signed)
Rescheduled

## 2020-08-26 ENCOUNTER — Ambulatory Visit: Payer: BC Managed Care – PPO | Admitting: Physical Therapy

## 2020-08-26 ENCOUNTER — Encounter: Payer: Self-pay | Admitting: Physical Medicine and Rehabilitation

## 2020-09-01 ENCOUNTER — Ambulatory Visit: Payer: BC Managed Care – PPO | Admitting: Physical Medicine and Rehabilitation

## 2020-09-13 ENCOUNTER — Ambulatory Visit: Payer: BC Managed Care – PPO | Admitting: Physical Medicine and Rehabilitation

## 2020-09-13 ENCOUNTER — Telehealth: Payer: Self-pay

## 2020-09-13 NOTE — Telephone Encounter (Signed)
Returned pt called and r/s.

## 2020-09-13 NOTE — Telephone Encounter (Signed)
Patient called regarding her appointment with Dr.Newton scheduled for today,patient needs to r/s call back:[phone]

## 2020-10-01 ENCOUNTER — Telehealth: Payer: Self-pay | Admitting: Physical Medicine and Rehabilitation

## 2020-10-01 NOTE — Telephone Encounter (Signed)
Pt called to resch her appt on Monday the 18th.

## 2020-10-04 ENCOUNTER — Ambulatory Visit: Payer: BC Managed Care – PPO | Admitting: Physical Medicine and Rehabilitation

## 2020-10-04 IMAGING — DX DG SHOULDER 2+V*R*
3 series · 3 of 3 positions shown · non-contrast
Comparison: None.

CLINICAL DATA: Right shoulder pain and tenderness, progressive
worsening

EXAM:
RIGHT SHOULDER - 2+ VIEW

[shoulder grashey]
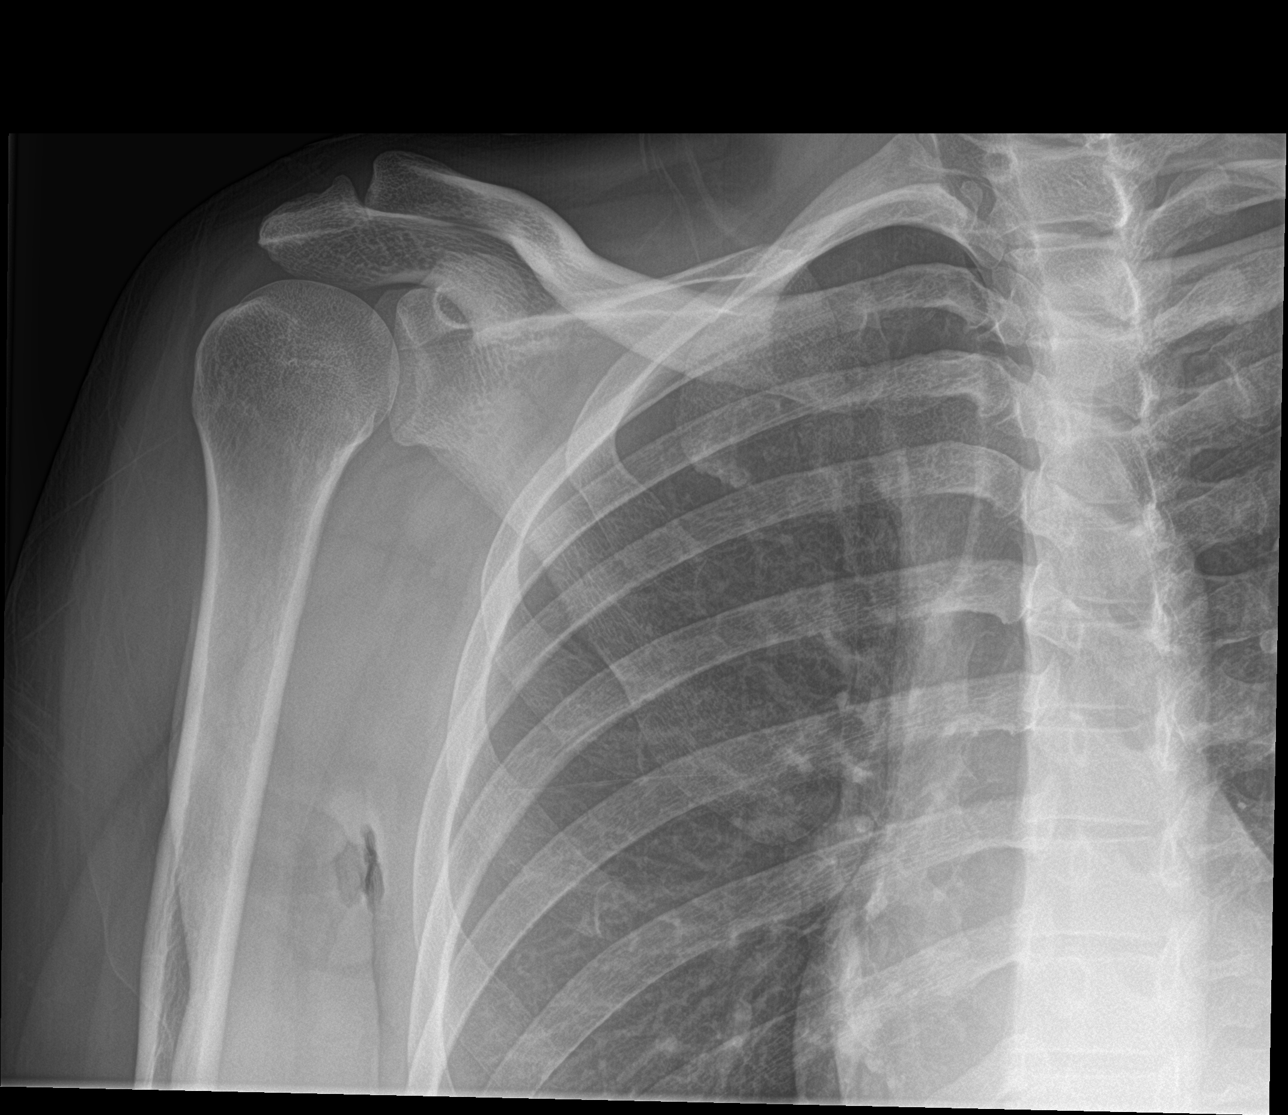

[shoulder y view]
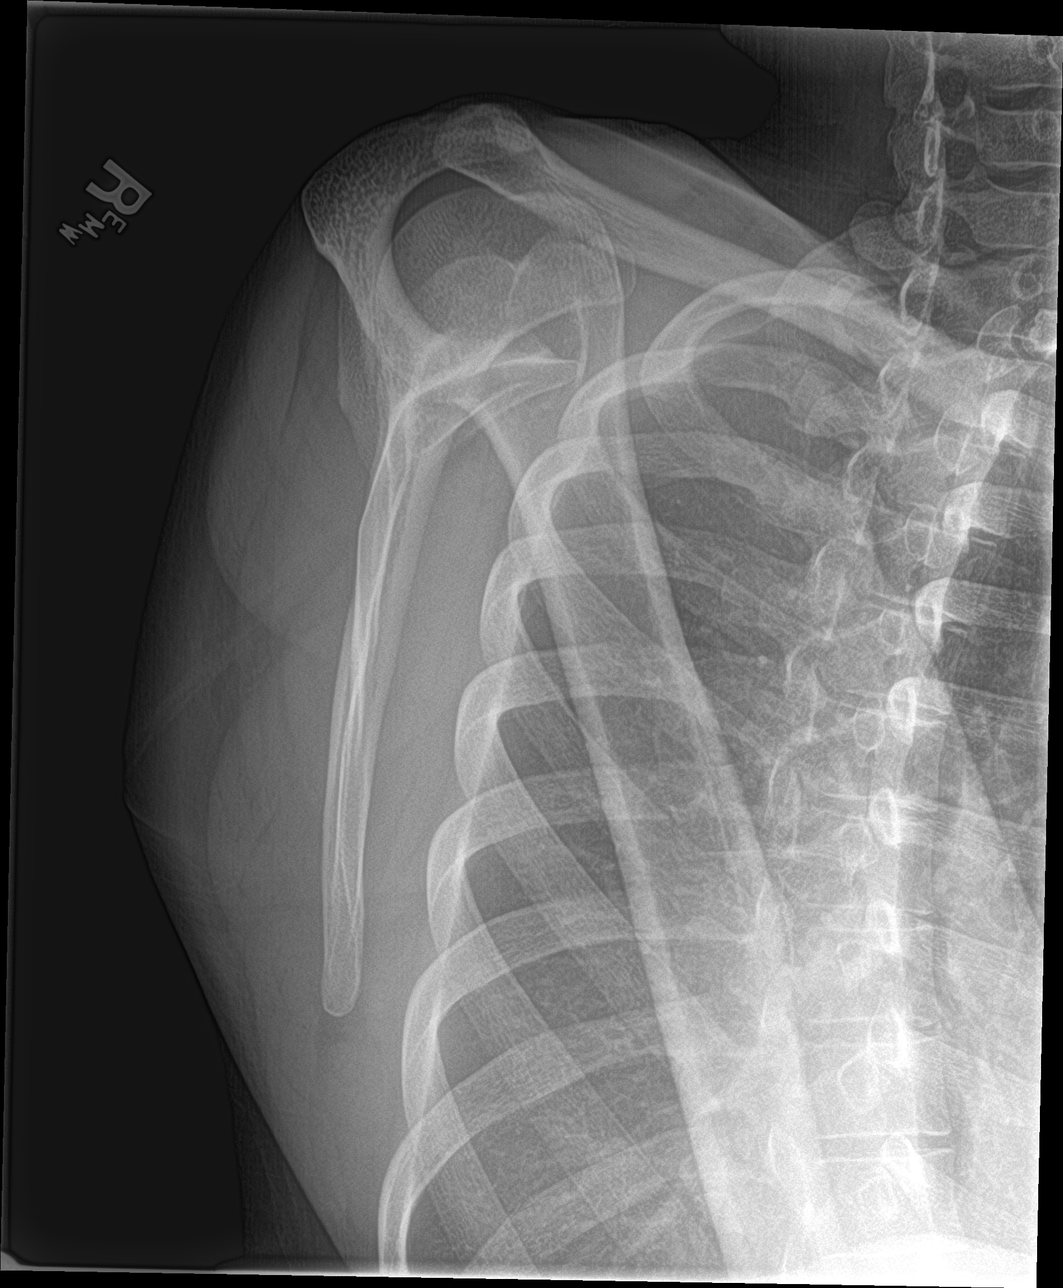

[shoulder axillary]
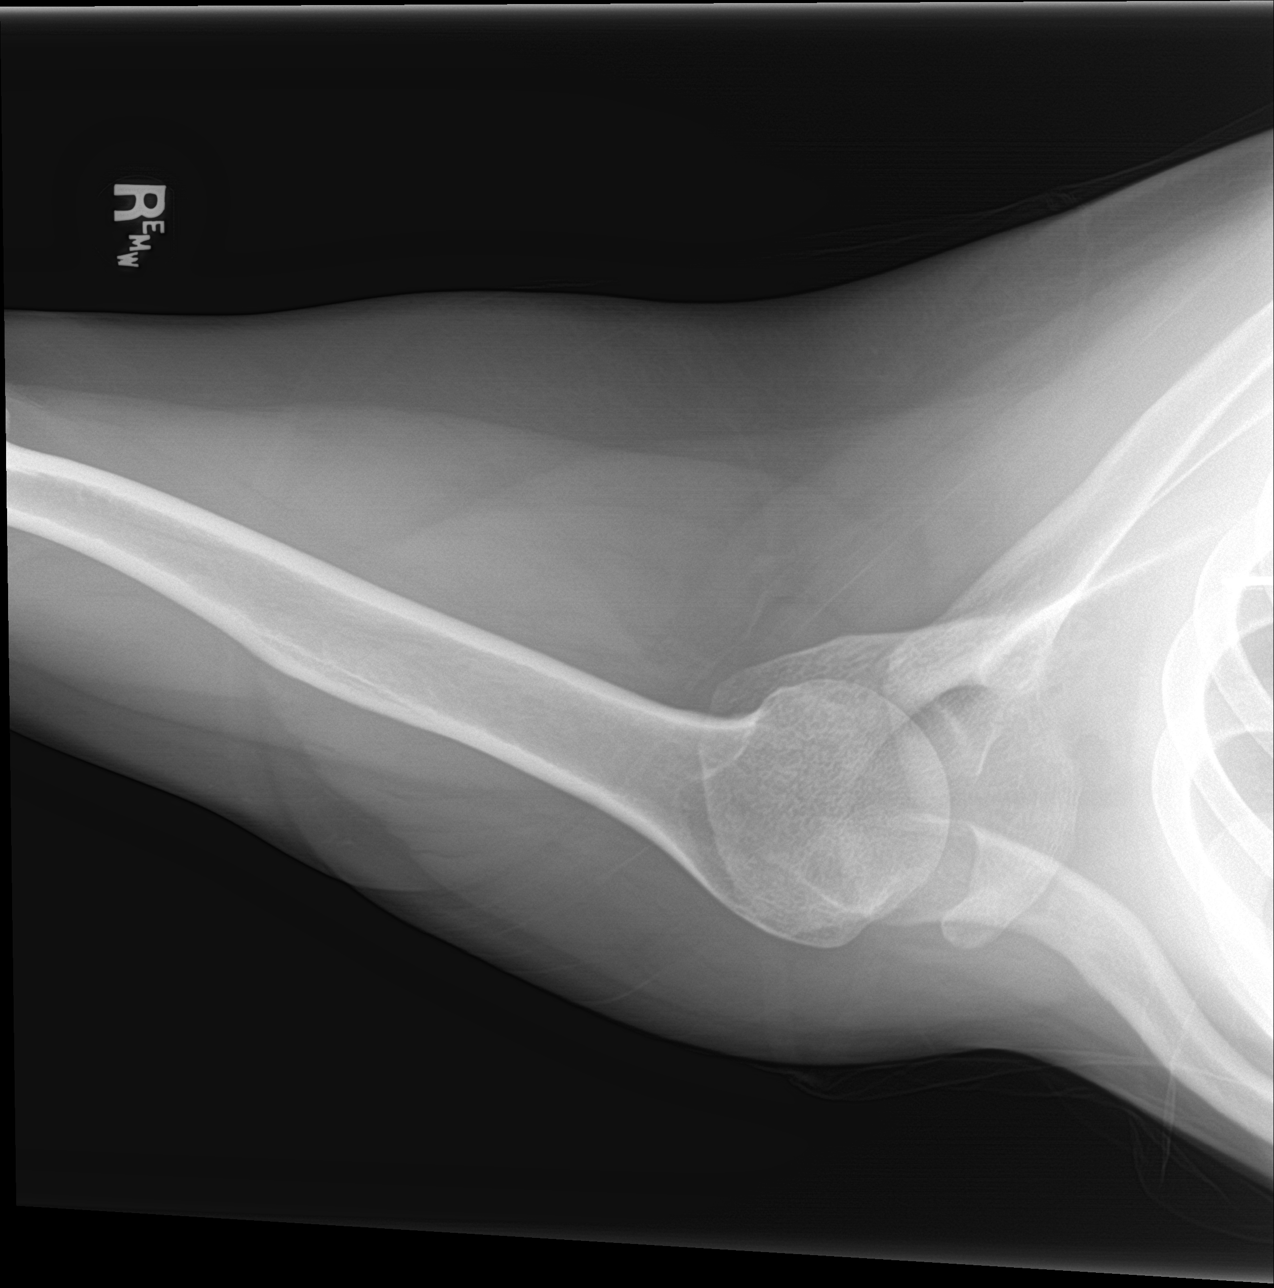

[3 of 3 positions shown; findings below may reference images not displayed]

FINDINGS: Frontal, transscapular, and axillary views of the right shoulder are
obtained. No fracture, subluxation, or dislocation. Joint spaces are
well preserved. Right chest is clear.
IMPRESSION: 1. Unremarkable right shoulder.

## 2020-11-05 ENCOUNTER — Ambulatory Visit: Payer: BC Managed Care – PPO | Admitting: Physical Medicine and Rehabilitation

## 2020-11-12 ENCOUNTER — Encounter: Payer: BC Managed Care – PPO | Admitting: Physical Medicine and Rehabilitation

## 2021-01-03 ENCOUNTER — Encounter: Payer: Self-pay | Admitting: Physical Medicine and Rehabilitation

## 2021-02-15 ENCOUNTER — Ambulatory Visit: Payer: Self-pay | Admitting: Physical Medicine and Rehabilitation

## 2021-03-18 ENCOUNTER — Ambulatory Visit: Payer: Self-pay | Admitting: Physical Medicine and Rehabilitation

## 2021-04-19 ENCOUNTER — Encounter: Payer: Self-pay | Admitting: Physical Medicine and Rehabilitation

## 2021-10-31 ENCOUNTER — Other Ambulatory Visit: Payer: Self-pay | Admitting: Physical Medicine and Rehabilitation

## 2022-11-27 ENCOUNTER — Encounter (HOSPITAL_COMMUNITY): Payer: Self-pay | Admitting: *Deleted

## 2022-11-27 ENCOUNTER — Ambulatory Visit (HOSPITAL_COMMUNITY)
Admission: EM | Admit: 2022-11-27 | Discharge: 2022-11-27 | Disposition: A | Discharge status: Home / Self Care | Payer: No Typology Code available for payment source | Attending: Family Medicine | Admitting: Family Medicine

## 2022-11-27 ENCOUNTER — Other Ambulatory Visit: Payer: Self-pay

## 2022-11-27 DIAGNOSIS — M436 Torticollis: Secondary | ICD-10-CM

## 2022-11-27 MED ORDER — TIZANIDINE HCL 4 MG PO TABS: 4.0000 mg | ORAL_TABLET | Freq: Four times a day (QID) | ORAL | 0 refills | Status: DC | PRN

## 2022-11-27 MED ORDER — KETOROLAC TROMETHAMINE 60 MG/2ML IM SOLN
60.0000 mg | Freq: Once | INTRAMUSCULAR | Status: AC
  Administered 2022-11-27: 60 mg via INTRAMUSCULAR

## 2022-11-27 MED ORDER — KETOROLAC TROMETHAMINE 60 MG/2ML IM SOLN
INTRAMUSCULAR | Status: AC
  Filled 2022-11-27: qty 2

## 2022-11-27 MED ORDER — DIPHENHYDRAMINE HCL 50 MG/ML IJ SOLN
50.0000 mg | Freq: Once | INTRAMUSCULAR | Status: AC
  Administered 2022-11-27: 50 mg via INTRAMUSCULAR

## 2022-11-27 MED ORDER — DIPHENHYDRAMINE HCL 50 MG/ML IJ SOLN
INTRAMUSCULAR | Status: AC
  Filled 2022-11-27: qty 1

## 2022-11-27 NOTE — ED Provider Notes (Signed)
[facility]   [id number] 11/27/22 Arrival Time: 0931  ASSESSMENT & PLAN:  1. Torticollis, acute    Meds ordered this encounter  Medications   tiZANidine (ZANAFLEX) 4 MG tablet    Sig: Take 1 tablet (4 mg total) by mouth every 6 (six) hours as needed for muscle spasms.    Dispense:  30 tablet    Refill:  0   diphenhydrAMINE (BENADRYL) injection 50 mg   ketorolac (TORADOL) injection 60 mg   Medication sedation precautions given. Activities as tolerated. Work note provided.   Follow-up Information     Verlon Au, MD.   Specialty: Family Medicine Why: If worsening or failing to improve as anticipated. Contact information: [address] [place] [address] [phone]                 Reviewed expectations re: course of current medical issues. Questions answered. Outlined signs and symptoms indicating need for more acute intervention. Patient verbalized understanding. After Visit Summary given.  SUBJECTIVE: History from: patient. Jennifer Roth is a 47 y.o. female who presents with complaint of a R neck muscle spasm; "severe"; abrupt onset past 24 hours; h/o same on same side a year or so ago; muscle relaxer helped. Denies injury. No extremity sensation changes or weakness.  No tx PTA. Denies fever/recent illness. Normal PO intake without n/v/d.  OBJECTIVE:  Vitals:   11/27/22 1040  BP: 117/84  Pulse: 79  Resp: 18  Temp: 98.3 F (36.8 C)  SpO2: 96%    GCS: 15 General appearance: alert; no distress HEENT: normocephalic; atraumatic; conjunctivae normal Neck: supple with FROM but moves slowly; no midline tenderness; does have tenderness of cervical musculature extending over trapezius distribution only on the right Lungs: clear to auscultation bilaterally; unlabored Heart: regular Extremities: moves all extremities normally; no edema; symmetrical with no gross deformities Skin: warm and dry Neurologic: gait  normal; normal sensation and strength of bilateral UE Psychological: alert and cooperative; normal mood and affect   Allergies  Allergen Reactions   Nsaids    Past Medical History:  Diagnosis Date   Medical history non-contributory    No pertinent past medical history    Past Surgical History:  Procedure Laterality Date   TUBAL LIGATION  2003   History reviewed. No pertinent family history. Social History   Socioeconomic History   Marital status: Single    Spouse name: Not on file   Number of children: Not on file   Years of education: Not on file   Highest education level: Not on file  Occupational History   Not on file  Tobacco Use   Smoking status: Former   Smokeless tobacco: Never  Vaping Use   Vaping status: Never Used  Substance and Sexual Activity   Alcohol use: Yes    Comment: none   Drug use: No   Sexual activity: Yes    Birth control/protection: Condom, Surgical  Other Topics Concern   Not on file  Social History Narrative   Not on file   Social Determinants of Health   Financial Resource Strain: Unknown (02/05/2020)   Received from [facility] visits prior to 05/20/2022., [facility] visits prior to 05/20/2022.   Overall Financial Resource Strain (CARDIA)    Difficulty of Paying Living Expenses: Patient refused  Food Insecurity: Low Risk  (10/28/2022)   [facility]   Hunger Vital Sign    Worried About Running  Out of Food in the Last Year: Never true    Ran Out of Food in the Last Year: Never true  Transportation Needs: Not on file (10/28/2022)  Recent Concern: Transportation Needs - Unmet Transportation Needs (10/28/2022)   Received from [employer]    In the past 12 months, has lack of reliable transportation kept you from medical appointments, meetings, work or from getting things needed for daily living? : Yes  Physical Activity: Unknown (02/05/2020)   [facility]  [facility] visits prior to 05/20/2022., [facility] visits prior to 05/20/2022.   Exercise Vital Sign    Days of Exercise per Week: 0 days    Minutes of Exercise per Session: Not on file  Stress: No Stress Concern Present (02/05/2020)   Received from [facility] visits prior to 05/20/2022., [facility] visits prior to 05/20/2022.   [employer] of Occupational Health - Occupational Stress Questionnaire    Feeling of Stress : Not at all  Social Connections: Unknown (02/05/2020)   [facility] visits prior to 05/20/2022., [facility] visits prior to 05/20/2022.   Social Advertising account executive [NHANES]    Frequency of Communication with Friends and Family: Once a week    Frequency of Social Gatherings with Friends and Family: Patient refused    Attends Religious Services: More than 4 times per year    Active Member of [employer] or Organizations: No    Attends Banker Meetings: Never    Marital Status: Never married           [place], [place], MD 11/27/22 1259

## 2022-11-27 NOTE — Discharge Instructions (Addendum)
Meds ordered this encounter  Medications   tiZANidine (ZANAFLEX) 4 MG tablet    Sig: Take 1 tablet (4 mg total) by mouth every 6 (six) hours as needed for muscle spasms.    Dispense:  30 tablet    Refill:  0   diphenhydrAMINE (BENADRYL) injection 50 mg   ketorolac (TORADOL) injection 60 mg

## 2022-11-27 NOTE — ED Triage Notes (Signed)
Pt reports Rt sided neck muscle spasms . Pt unable to see rehab MD until OCT

## 2022-12-06 ENCOUNTER — Encounter: Payer: Self-pay | Admitting: Physical Medicine and Rehabilitation

## 2022-12-07 ENCOUNTER — Encounter
Payer: No Typology Code available for payment source | Attending: Physical Medicine and Rehabilitation | Admitting: Physical Medicine and Rehabilitation

## 2022-12-07 DIAGNOSIS — M62838 Other muscle spasm: Secondary | ICD-10-CM | POA: Diagnosis not present

## 2022-12-07 DIAGNOSIS — K589 Irritable bowel syndrome without diarrhea: Secondary | ICD-10-CM | POA: Diagnosis not present

## 2022-12-07 MED ORDER — BACLOFEN 10 MG PO TABS: 10.0000 mg | ORAL_TABLET | Freq: Three times a day (TID) | ORAL | 0 refills | Status: AC | PRN
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
## 2022-12-26 ENCOUNTER — Ambulatory Visit: Payer: No Typology Code available for payment source | Attending: Physical Medicine and Rehabilitation

## 2022-12-26 ENCOUNTER — Other Ambulatory Visit: Payer: Self-pay

## 2022-12-26 DIAGNOSIS — R293 Abnormal posture: Secondary | ICD-10-CM | POA: Insufficient documentation

## 2022-12-26 DIAGNOSIS — M62838 Other muscle spasm: Secondary | ICD-10-CM | POA: Insufficient documentation

## 2022-12-26 DIAGNOSIS — M542 Cervicalgia: Secondary | ICD-10-CM | POA: Diagnosis present

## 2022-12-26 DIAGNOSIS — M6281 Muscle weakness (generalized): Secondary | ICD-10-CM | POA: Diagnosis present

## 2022-12-26 NOTE — Therapy (Signed)
OUTPATIENT PHYSICAL THERAPY CERVICAL EVALUATION   Patient Name: Jennifer Roth MRN: [id number] DOB:05/06/1975, 47 y.o., female Today's Date: 12/26/2022  END OF SESSION:  PT End of Session - 12/26/22 1627     Visit Number 1    Number of Visits 17    Date for PT Re-Evaluation 02/20/23    Authorization Type [employer] MCD    PT Start Time 1445    PT Stop Time 1519    PT Time Calculation (min) 34 min    Activity Tolerance Patient tolerated treatment well    Behavior During Therapy WFL for tasks assessed/performed             Past Medical History:  Diagnosis Date   Medical history non-contributory    No pertinent past medical history    Past Surgical History:  Procedure Laterality Date   TUBAL LIGATION  2003   There are no problems to display for this patient.   PCP: Verlon Au, MD  REFERRING PROVIDER: Horton Chin, MD  REFERRING DIAG: [phone] (ICD-10-CM) - Muscle spasm   THERAPY DIAG:  Cervicalgia  Muscle weakness (generalized)  Abnormal posture  Rationale for Evaluation and Treatment: Rehabilitation  ONSET DATE: Chronic - 11/27/22 recent exacerbation   SUBJECTIVE:                                                                                                                                                                                                         SUBJECTIVE STATEMENT: Pt presents to PT with reports of chronic neck pain with recent exacerbation on 11/27/22 with tightness in SCM and upper trap. Denies bowel/bladder changes or saddle anesthesia, does have N/T in L UE down to hand. Feels limited in work and other daily activities secondary to pain. Continues to have HA symptoms into ram horn pattern.   Hand dominance: Right  PERTINENT HISTORY:  See PMH  PAIN:  Are you having pain?  Yes: NPRS scale: 3/10 Worst: 10/10 Pain location: neck, R upper trap, mid back Pain description: sharp, tight Aggravating factors: movement, work at  computer Relieving factors: heat, medication  PRECAUTIONS: None  RED FLAGS: None    WEIGHT BEARING RESTRICTIONS: No  FALLS:  Has patient fallen in last 6 months? No  LIVING ENVIRONMENT: Lives with: lives with their family Lives in: House/apartment  OCCUPATION: works for [employer]  PLOF: Independent  PATIENT GOALS: decrease neck pain, improve posture  NEXT MD VISIT: 12/29/2022  OBJECTIVE:  Note: Objective measures were completed at Evaluation unless otherwise noted.  DIAGNOSTIC FINDINGS:  See imaging  PATIENT SURVEYS:  FOTO: 52% function; 66% predicted  COGNITION: Overall cognitive status: Within functional limits for tasks assessed  SENSATION: Light touch: Impaired - L UE  POSTURE: rounded shoulders and forward head  PALPATION: TTP to bilateral upper traps, cervical paraspinals   CERVICAL ROM:   Active ROM A/PROM (deg) eval  Flexion   Extension   Right lateral flexion   Left lateral flexion   Right rotation 50 p!  Left rotation 55   (Blank rows = not tested)  UPPER EXTREMITY ROM:  Active ROM Right eval Left eval  Shoulder flexion    Shoulder extension    Shoulder abduction    Shoulder adduction    Shoulder extension    Shoulder internal rotation    Shoulder external rotation    Elbow flexion    Elbow extension    Wrist flexion    Wrist extension    Wrist ulnar deviation    Wrist radial deviation    Wrist pronation    Wrist supination     (Blank rows = not tested)  UPPER EXTREMITY MMT:  MMT Right eval Left eval  Shoulder flexion    Shoulder extension    Shoulder abduction    Shoulder adduction    Shoulder extension    Shoulder internal rotation    Shoulder external rotation    Middle trapezius    Lower trapezius    Elbow flexion    Elbow extension    Wrist flexion    Wrist extension    Wrist ulnar deviation    Wrist radial deviation    Wrist pronation    Wrist supination    Grip strength     (Blank rows = not  tested)  FUNCTIONAL TESTS:  Deep Cervical Flexor Endurance: 5 sec  TREATMENT: [facility] Adult PT Treatment:                                                DATE: 12/26/2022 Therapeutic Exercise: Supine chin tuck x 5 - 5" hold Seated upper trap stretch x 30" each Seated scapular retraction x 5 Manual Therapy: Positional release to bilateral  Suboccipital release  PATIENT EDUCATION:  Education details: eval findings, FOTO, HEP, POC Person educated: Patient Education method: Explanation, Demonstration, and Handouts Education comprehension: verbalized understanding and returned demonstration  HOME EXERCISE PROGRAM: Access Code: [email or web address] URL: [email or web address] Date: 12/26/2022 Prepared by: Edwinna Areola  Exercises - Supine Chin Tuck  - 1 x daily - 7 x weekly - 2 sets - 10 reps - 5 sec hold - Seated Upper Trapezius Stretch  - 1 x daily - 7 x weekly - 2-3 reps - 30 sec hold - Seated Scapular Retraction  - 1 x daily - 7 x weekly - 2 sets - 10 reps - 3 sec hold  ASSESSMENT:  CLINICAL IMPRESSION: Patient is a 47 y.o. F who was seen today for physical therapy evaluation and treatment for chronic neck pain. Physical findings are consistent with MD impression as pt demonstrates decrease in cervical ROM and DNF strength and endurance. FOTO score shows decrease in subjective functional ability below PLOF. Pt would benefit from skilled PT services working on improving strength and decreasing soft tissue pain.    OBJECTIVE IMPAIRMENTS: decreased activity tolerance, decreased endurance, decreased mobility, difficulty walking, decreased ROM, decreased strength, impaired UE functional use, postural dysfunction, and pain  ACTIVITY  LIMITATIONS: carrying, lifting, bending, sitting, standing, dressing, reach over head, and hygiene/grooming  PARTICIPATION LIMITATIONS: meal prep, cleaning, driving, shopping, community activity, occupation, and yard work  PERSONAL FACTORS: Time since onset  of injury/illness/exacerbation are also affecting patient's functional outcome.   REHAB POTENTIAL: Excellent  CLINICAL DECISION MAKING: Stable/uncomplicated  EVALUATION COMPLEXITY: Low   GOALS: Goals reviewed with patient? No  SHORT TERM GOALS: Target date: 01/16/2023   Pt will be compliant and knowledgeable with initial HEP for improved comfort and carryover Baseline: initial HEP given  Goal status: INITIAL  2.  Pt will self report neck and upper trap pain no greater than 6/10 for improved comfort and functional ability Baseline: 10/10 at worst Goal status: INITIAL   LONG TERM GOALS: Target date: 02/20/2023   Pt will improve FOTO function score to no less than 66% as proxy for functional improvement Baseline: 52% function Goal status: INITIAL   2.  Pt will self report neck upper trap pain no greater than 1-2/10 for improved comfort and functional ability Baseline: 10/10 at worst Goal status: INITIAL   3.  Pt will improve deep cervical flexor endurance to no less than 20 seconds for improved postural endurance and decrease neck pain Baseline: 5 seconds Goal status: INITIAL  4.  Pt will improve bilateral cervical rotation to no less than 70 degrees for improved comfort with driving and work activities  Baseline: see ROM chart Goal status: INITIAL   PLAN:  PT FREQUENCY: 1-2x/week  PT DURATION: 8 weeks  PLANNED INTERVENTIONS: Therapeutic exercises, Therapeutic activity, Neuromuscular re-education, Balance training, Gait training, Patient/Family education, Self Care, Joint mobilization, Dry Needling, Electrical stimulation, Cryotherapy, Moist heat, Vasopneumatic device, Manual therapy, and Re-evaluation  PLAN FOR NEXT SESSION: assess HEP response, DNF and periscapular strength, TPDN to upper traps   Eloy End, PT 12/26/2022, 4:28 PM

## 2022-12-29 ENCOUNTER — Encounter: Payer: Self-pay | Admitting: Physical Medicine and Rehabilitation

## 2022-12-29 ENCOUNTER — Encounter
Payer: No Typology Code available for payment source | Attending: Physical Medicine and Rehabilitation | Admitting: Physical Medicine and Rehabilitation

## 2022-12-29 VITALS — BP 122/85 | HR 74 | Ht 65.0 in | Wt 161.0 lb

## 2022-12-29 DIAGNOSIS — M7918 Myalgia, other site: Secondary | ICD-10-CM | POA: Diagnosis present

## 2022-12-29 MED ORDER — LIDOCAINE HCL 1 % IJ SOLN
3.0000 mL | Freq: Once | INTRAMUSCULAR | Status: AC
Start: 2022-12-29 — End: 2022-12-29
  Administered 2022-12-29: 3 mL

## 2022-12-29 NOTE — Progress Notes (Signed)

## 2023-01-02 ENCOUNTER — Encounter: Payer: Self-pay | Admitting: Physical Therapy

## 2023-01-02 ENCOUNTER — Ambulatory Visit: Payer: No Typology Code available for payment source | Admitting: Physical Therapy

## 2023-01-02 DIAGNOSIS — R293 Abnormal posture: Secondary | ICD-10-CM

## 2023-01-02 DIAGNOSIS — M542 Cervicalgia: Secondary | ICD-10-CM

## 2023-01-02 DIAGNOSIS — M6281 Muscle weakness (generalized): Secondary | ICD-10-CM

## 2023-01-02 NOTE — Therapy (Signed)
OUTPATIENT PHYSICAL THERAPY CERVICAL EVALUATION   Patient Name: Jennifer Roth MRN: [id number] DOB:09/01/1975, 47 y.o., female Today's Date: 01/02/2023  END OF SESSION:  PT End of Session - 01/02/23 1607     Visit Number 2    Number of Visits 17    Date for PT Re-Evaluation 02/20/23    Authorization Type [employer] MCD    PT Start Time 0330    PT Stop Time 0408    PT Time Calculation (min) 38 min              Past Medical History:  Diagnosis Date   Medical history non-contributory    No pertinent past medical history    Past Surgical History:  Procedure Laterality Date   TUBAL LIGATION  2003   There are no problems to display for this patient.   PCP: Verlon Au, MD  REFERRING PROVIDER: Horton Chin, MD  REFERRING DIAG: [phone] (ICD-10-CM) - Muscle spasm   THERAPY DIAG:  Cervicalgia  Muscle weakness (generalized)  Abnormal posture  Rationale for Evaluation and Treatment: Rehabilitation  ONSET DATE: Chronic - 11/27/22 recent exacerbation   SUBJECTIVE:                                                                                                                                                                                                         SUBJECTIVE STATEMENT: Pt reports the exercises are going okay, she tends to over do things. No Radicular sx today. I've noticed a new right low back pain , maybe the new chair at work.   EVAL: Pt presents to PT with reports of chronic neck pain with recent exacerbation on 11/27/22 with tightness in SCM and upper trap. Denies bowel/bladder changes or saddle anesthesia, does have N/T in L UE down to hand. Feels limited in work and other daily activities secondary to pain. Continues to have HA symptoms into ram horn pattern.   Hand dominance: Right  PERTINENT HISTORY:  See PMH  PAIN:  Are you having pain?  Yes: NPRS scale: 4/10 Worst: 10/10 Pain location: neck, R upper trap, R mid back Pain  description: achy , dull  Aggravating factors: movement, work at computer Relieving factors: heat, medication  PRECAUTIONS: None  RED FLAGS: None    WEIGHT BEARING RESTRICTIONS: No  FALLS:  Has patient fallen in last 6 months? No  LIVING ENVIRONMENT: Lives with: lives with their family Lives in: House/apartment  OCCUPATION: works for [employer]  PLOF: Independent  PATIENT GOALS: decrease neck pain, improve posture  NEXT MD VISIT: 12/29/2022  OBJECTIVE:  Note: Objective measures were completed at Evaluation unless otherwise noted.  DIAGNOSTIC FINDINGS:  See imaging  PATIENT SURVEYS:  FOTO: 52% function; 66% predicted  COGNITION: Overall cognitive status: Within functional limits for tasks assessed  SENSATION: Light touch: Impaired - L UE  POSTURE: rounded shoulders and forward head  PALPATION: TTP to bilateral upper traps, cervical paraspinals   CERVICAL ROM:   Active ROM A/PROM (deg) eval  Flexion   Extension   Right lateral flexion   Left lateral flexion   Right rotation 50 p!  Left rotation 55   (Blank rows = not tested)  UPPER EXTREMITY ROM:  Active ROM Right eval Left eval  Shoulder flexion    Shoulder extension    Shoulder abduction    Shoulder adduction    Shoulder extension    Shoulder internal rotation    Shoulder external rotation    Elbow flexion    Elbow extension    Wrist flexion    Wrist extension    Wrist ulnar deviation    Wrist radial deviation    Wrist pronation    Wrist supination     (Blank rows = not tested)  UPPER EXTREMITY MMT:  MMT Right eval Left eval  Shoulder flexion    Shoulder extension    Shoulder abduction    Shoulder adduction    Shoulder extension    Shoulder internal rotation    Shoulder external rotation    Middle trapezius    Lower trapezius    Elbow flexion    Elbow extension    Wrist flexion    Wrist extension    Wrist ulnar deviation    Wrist radial deviation    Wrist pronation    Wrist  supination    Grip strength     (Blank rows = not tested)  FUNCTIONAL TESTS:  Deep Cervical Flexor Endurance: 5 sec  TREATMENT: [facility] Adult PT Treatment:                                                DATE: 01/02/23 Therapeutic Exercise: Seated chin tuck Seated scap retract x 8 c/o right mid/low back pain Seated upper trap stretch, scalenes  Seated levator stretch  Supine chin tuck over towel roll x 10 Supine horizontal abdct red band x 10 Supine shoulder ER red band x 10  Updated HEP Manual Therapy: Cervical distraction Positional upper trap stretch bilateral  Levator stretch bilateral  STW to cervical paraspinals   [facility] Adult PT Treatment:                                                DATE: 12/26/2022 Therapeutic Exercise: Supine chin tuck x 5 - 5" hold Seated upper trap stretch x 30" each Seated scapular retraction x 5 Manual Therapy: Positional release to bilateral  Suboccipital release  PATIENT EDUCATION:  Education details: eval findings, FOTO, HEP, POC Person educated: Patient Education method: Explanation, Demonstration, and Handouts Education comprehension: verbalized understanding and returned demonstration  HOME EXERCISE PROGRAM: Access Code: [email or web address] URL: [email or web address] Date: 12/26/2022 Prepared by: Edwinna Areola  Exercises - Supine Chin Tuck  - 1 x daily - 7 x weekly - 2 sets - 10 reps - 5 sec hold -  Seated Upper Trapezius Stretch  - 1 x daily - 7 x weekly - 2-3 reps - 30 sec hold - Seated Scapular Retraction  - 1 x daily - 7 x weekly - 2 sets - 10 reps - 3 sec hold - Supine Shoulder Horizontal Abduction with Resistance  - 1 x daily - 7 x weekly - 2 sets - 10 reps - Supine shoulder external rotation- KNEES BENT  - 1 x daily - 7 x weekly - 2 sets - 10 reps  ASSESSMENT:  CLINICAL IMPRESSION: Pt reports compliance with HEP.  Minimal pain on arrival and no radicular symptoms. Reviewed and progressed HEP to include periscapular  strength. Manual performed for passive upper trap stretching and cervical distraction.   EVAL: Patient is a 47 y.o. F who was seen today for physical therapy evaluation and treatment for chronic neck pain. Physical findings are consistent with MD impression as pt demonstrates decrease in cervical ROM and DNF strength and endurance. FOTO score shows decrease in subjective functional ability below PLOF. Pt would benefit from skilled PT services working on improving strength and decreasing soft tissue pain.    OBJECTIVE IMPAIRMENTS: decreased activity tolerance, decreased endurance, decreased mobility, difficulty walking, decreased ROM, decreased strength, impaired UE functional use, postural dysfunction, and pain  ACTIVITY LIMITATIONS: carrying, lifting, bending, sitting, standing, dressing, reach over head, and hygiene/grooming  PARTICIPATION LIMITATIONS: meal prep, cleaning, driving, shopping, community activity, occupation, and yard work  PERSONAL FACTORS: Time since onset of injury/illness/exacerbation are also affecting patient's functional outcome.   REHAB POTENTIAL: Excellent  CLINICAL DECISION MAKING: Stable/uncomplicated  EVALUATION COMPLEXITY: Low   GOALS: Goals reviewed with patient? No  SHORT TERM GOALS: Target date: 01/16/2023   Pt will be compliant and knowledgeable with initial HEP for improved comfort and carryover Baseline: initial HEP given  Goal status: INITIAL  2.  Pt will self report neck and upper trap pain no greater than 6/10 for improved comfort and functional ability Baseline: 10/10 at worst Goal status: INITIAL   LONG TERM GOALS: Target date: 02/20/2023   Pt will improve FOTO function score to no less than 66% as proxy for functional improvement Baseline: 52% function Goal status: INITIAL   2.  Pt will self report neck upper trap pain no greater than 1-2/10 for improved comfort and functional ability Baseline: 10/10 at worst Goal status: INITIAL   3.   Pt will improve deep cervical flexor endurance to no less than 20 seconds for improved postural endurance and decrease neck pain Baseline: 5 seconds Goal status: INITIAL  4.  Pt will improve bilateral cervical rotation to no less than 70 degrees for improved comfort with driving and work activities  Baseline: see ROM chart Goal status: INITIAL   PLAN:  PT FREQUENCY: 1-2x/week  PT DURATION: 8 weeks  PLANNED INTERVENTIONS: Therapeutic exercises, Therapeutic activity, Neuromuscular re-education, Balance training, Gait training, Patient/Family education, Self Care, Joint mobilization, Dry Needling, Electrical stimulation, Cryotherapy, Moist heat, Vasopneumatic device, Manual therapy, and Re-evaluation  PLAN FOR NEXT SESSION: assess HEP response, DNF and periscapular strength, TPDN to upper traps   Jannette Spanner, PTA 01/02/23 4:07 PM Phone: [phone] Fax: [phone]

## 2023-01-09 ENCOUNTER — Ambulatory Visit: Payer: No Typology Code available for payment source

## 2023-01-09 DIAGNOSIS — R293 Abnormal posture: Secondary | ICD-10-CM

## 2023-01-09 DIAGNOSIS — M542 Cervicalgia: Secondary | ICD-10-CM | POA: Diagnosis not present

## 2023-01-09 DIAGNOSIS — M6281 Muscle weakness (generalized): Secondary | ICD-10-CM

## 2023-01-09 NOTE — Therapy (Unsigned)
OUTPATIENT PHYSICAL THERAPY TREATMENT   Patient Name: Jennifer Roth MRN: [id number] DOB:10-Sep-1975, 47 y.o., female Today's Date: 01/10/2023  END OF SESSION:  PT End of Session - 01/09/23 1554     Visit Number 3    Number of Visits 17    Date for PT Re-Evaluation 02/20/23    Authorization Type [employer] MCD    PT Start Time 1615    PT Stop Time 1655    PT Time Calculation (min) 40 min               Past Medical History:  Diagnosis Date   Medical history non-contributory    No pertinent past medical history    Past Surgical History:  Procedure Laterality Date   TUBAL LIGATION  2003   There are no problems to display for this patient.   PCP: Verlon Au, MD  REFERRING PROVIDER: Horton Chin, MD  REFERRING DIAG: [id number] (ICD-10-CM) - Muscle spasm   THERAPY DIAG:  Cervicalgia  Muscle weakness (generalized)  Abnormal posture  Rationale for Evaluation and Treatment: Rehabilitation  ONSET DATE: Chronic - 11/27/22 recent exacerbation   SUBJECTIVE:                                                                                                                                                                                                         SUBJECTIVE STATEMENT: Pt presents to PT with reports of continued neck pain and discomfort. Has been compliant with HEP with no adverse effect.  EVAL: Pt presents to PT with reports of chronic neck pain with recent exacerbation on 11/27/22 with tightness in SCM and upper trap. Denies bowel/bladder changes or saddle anesthesia, does have N/T in L UE down to hand. Feels limited in work and other daily activities secondary to pain. Continues to have HA symptoms into ram horn pattern.   Hand dominance: Right  PERTINENT HISTORY:  See PMH  PAIN:  Are you having pain?  Yes: NPRS scale: 4/10 Worst: 10/10 Pain location: neck, R upper trap, R mid back Pain description: achy , dull  Aggravating factors: movement,  work at computer Relieving factors: heat, medication  PRECAUTIONS: None  RED FLAGS: None    WEIGHT BEARING RESTRICTIONS: No  FALLS:  Has patient fallen in last 6 months? No  LIVING ENVIRONMENT: Lives with: lives with their family Lives in: House/apartment  OCCUPATION: works for [employer]  PLOF: Independent  PATIENT GOALS: decrease neck pain, improve posture  NEXT MD VISIT: 12/29/2022  OBJECTIVE:  Note: Objective measures were completed at Evaluation unless otherwise  noted.  DIAGNOSTIC FINDINGS:  See imaging  PATIENT SURVEYS:  FOTO: 52% function; 66% predicted  COGNITION: Overall cognitive status: Within functional limits for tasks assessed  SENSATION: Light touch: Impaired - L UE  POSTURE: rounded shoulders and forward head  PALPATION: TTP to bilateral upper traps, cervical paraspinals   CERVICAL ROM:   Active ROM A/PROM (deg) eval  Flexion   Extension   Right lateral flexion   Left lateral flexion   Right rotation 50 p!  Left rotation 55   (Blank rows = not tested)  UPPER EXTREMITY ROM:  Active ROM Right eval Left eval  Shoulder flexion    Shoulder extension    Shoulder abduction    Shoulder adduction    Shoulder extension    Shoulder internal rotation    Shoulder external rotation    Elbow flexion    Elbow extension    Wrist flexion    Wrist extension    Wrist ulnar deviation    Wrist radial deviation    Wrist pronation    Wrist supination     (Blank rows = not tested)  UPPER EXTREMITY MMT:  MMT Right eval Left eval  Shoulder flexion    Shoulder extension    Shoulder abduction    Shoulder adduction    Shoulder extension    Shoulder internal rotation    Shoulder external rotation    Middle trapezius    Lower trapezius    Elbow flexion    Elbow extension    Wrist flexion    Wrist extension    Wrist ulnar deviation    Wrist radial deviation    Wrist pronation    Wrist supination    Grip strength     (Blank rows = not  tested)  FUNCTIONAL TESTS:  Deep Cervical Flexor Endurance: 5 sec  TREATMENT: [facility] Adult PT Treatment:                                                DATE: 01/09/23 Therapeutic Exercise: UBE lvl 1.0 x 3 min fwd while taking subjective Row 2x10 13# Seated bilateral ER 2x10 RTB Supine horizontal abd 2x10 GTB Supine dow flexion x 15 Supine chin tuck 2x10 Manual Therapy: Cervical distraction Positional upper trap stretch bilateral  Levator stretch bilateral  STW to cervical paraspinals and upper trap  [facility] Adult PT Treatment:                                                DATE: 01/02/23 Therapeutic Exercise: Seated chin tuck Seated scap retract x 8 c/o right mid/low back pain Seated upper trap stretch, scalenes  Seated levator stretch  Supine chin tuck over towel roll x 10 Supine horizontal abdct red band x 10 Supine shoulder ER red band x 10  Updated HEP Manual Therapy: Cervical distraction Positional upper trap stretch bilateral  Levator stretch bilateral  STW to cervical paraspinals   [facility] Adult PT Treatment:                                                DATE: 12/26/2022  Therapeutic Exercise: Supine chin tuck x 5 - 5" hold Seated upper trap stretch x 30" each Seated scapular retraction x 5 Manual Therapy: Positional release to bilateral  Suboccipital release  PATIENT EDUCATION:  Education details: eval findings, FOTO, HEP, POC Person educated: Patient Education method: Explanation, Demonstration, and Handouts Education comprehension: verbalized understanding and returned demonstration  HOME EXERCISE PROGRAM: Access Code: [email or web address] URL: [email or web address] Date: 12/26/2022 Prepared by: Edwinna Areola  Exercises - Supine Chin Tuck  - 1 x daily - 7 x weekly - 2 sets - 10 reps - 5 sec hold - Seated Upper Trapezius Stretch  - 1 x daily - 7 x weekly - 2-3 reps - 30 sec hold - Seated Scapular Retraction  - 1 x daily - 7 x weekly - 2 sets - 10 reps - 3  sec hold - Supine Shoulder Horizontal Abduction with Resistance  - 1 x daily - 7 x weekly - 2 sets - 10 reps - Supine shoulder external rotation- KNEES BENT  - 1 x daily - 7 x weekly - 2 sets - 10 reps  ASSESSMENT:  CLINICAL IMPRESSION: Pt was able to complete all prescribed exercises with no adverse effect. Therapy focused on improving DNF and periscapular strength for decreasing neck pain and discomfort. Responded well to manual therapy interventions, will continue to progress as able per POC.   EVAL: Patient is a 47 y.o. F who was seen today for physical therapy evaluation and treatment for chronic neck pain. Physical findings are consistent with MD impression as pt demonstrates decrease in cervical ROM and DNF strength and endurance. FOTO score shows decrease in subjective functional ability below PLOF. Pt would benefit from skilled PT services working on improving strength and decreasing soft tissue pain.    OBJECTIVE IMPAIRMENTS: decreased activity tolerance, decreased endurance, decreased mobility, difficulty walking, decreased ROM, decreased strength, impaired UE functional use, postural dysfunction, and pain  ACTIVITY LIMITATIONS: carrying, lifting, bending, sitting, standing, dressing, reach over head, and hygiene/grooming  PARTICIPATION LIMITATIONS: meal prep, cleaning, driving, shopping, community activity, occupation, and yard work  PERSONAL FACTORS: Time since onset of injury/illness/exacerbation are also affecting patient's functional outcome.   REHAB POTENTIAL: Excellent  CLINICAL DECISION MAKING: Stable/uncomplicated  EVALUATION COMPLEXITY: Low   GOALS: Goals reviewed with patient? No  SHORT TERM GOALS: Target date: 01/16/2023   Pt will be compliant and knowledgeable with initial HEP for improved comfort and carryover Baseline: initial HEP given  Goal status: INITIAL  2.  Pt will self report neck and upper trap pain no greater than 6/10 for improved comfort and  functional ability Baseline: 10/10 at worst Goal status: INITIAL   LONG TERM GOALS: Target date: 02/20/2023   Pt will improve FOTO function score to no less than 66% as proxy for functional improvement Baseline: 52% function Goal status: INITIAL   2.  Pt will self report neck upper trap pain no greater than 1-2/10 for improved comfort and functional ability Baseline: 10/10 at worst Goal status: INITIAL   3.  Pt will improve deep cervical flexor endurance to no less than 20 seconds for improved postural endurance and decrease neck pain Baseline: 5 seconds Goal status: INITIAL  4.  Pt will improve bilateral cervical rotation to no less than 70 degrees for improved comfort with driving and work activities  Baseline: see ROM chart Goal status: INITIAL   PLAN:  PT FREQUENCY: 1-2x/week  PT DURATION: 8 weeks  PLANNED INTERVENTIONS: Therapeutic exercises, Therapeutic activity, Neuromuscular re-education, Balance training, Gait training, Patient/Family  education, Self Care, Joint mobilization, Dry Needling, Electrical stimulation, Cryotherapy, Moist heat, Vasopneumatic device, Manual therapy, and Re-evaluation  PLAN FOR NEXT SESSION: assess HEP response, DNF and periscapular strength, TPDN to upper traps   Eloy End PT  01/10/23 7:57 AM

## 2023-01-18 ENCOUNTER — Ambulatory Visit: Payer: No Typology Code available for payment source

## 2023-01-18 DIAGNOSIS — M542 Cervicalgia: Secondary | ICD-10-CM | POA: Diagnosis not present

## 2023-01-18 DIAGNOSIS — M6281 Muscle weakness (generalized): Secondary | ICD-10-CM

## 2023-01-18 DIAGNOSIS — R293 Abnormal posture: Secondary | ICD-10-CM

## 2023-01-18 NOTE — Therapy (Signed)
OUTPATIENT PHYSICAL THERAPY TREATMENT   Patient Name: Jennifer Roth MRN: [id number] DOB:May 20, 1975, 47 y.o., female Today's Date: 01/18/2023  END OF SESSION:  PT End of Session - 01/18/23 1359     Visit Number 4    Number of Visits 17    Date for PT Re-Evaluation 02/20/23    Authorization Type [employer] MCD    PT Start Time 1400    PT Stop Time 1438    PT Time Calculation (min) 38 min                Past Medical History:  Diagnosis Date   Medical history non-contributory    No pertinent past medical history    Past Surgical History:  Procedure Laterality Date   TUBAL LIGATION  2003   There are no problems to display for this patient.   PCP: Verlon Au, MD  REFERRING PROVIDER: Horton Chin, MD  REFERRING DIAG: [phone] (ICD-10-CM) - Muscle spasm   THERAPY DIAG:  Cervicalgia  Muscle weakness (generalized)  Abnormal posture  Rationale for Evaluation and Treatment: Rehabilitation  ONSET DATE: Chronic - 11/27/22 recent exacerbation   SUBJECTIVE:                                                                                                                                                                                                         SUBJECTIVE STATEMENT: Pt presents to PT with reports of decreased neck pain and discomfort. Is more bothered by stomach pain today due to IBS. Has been compliant with HEP with no adverse effect.   EVAL: Pt presents to PT with reports of chronic neck pain with recent exacerbation on 11/27/22 with tightness in SCM and upper trap. Denies bowel/bladder changes or saddle anesthesia, does have N/T in L UE down to hand. Feels limited in work and other daily activities secondary to pain. Continues to have HA symptoms into ram horn pattern.   Hand dominance: Right  PERTINENT HISTORY:  See PMH  PAIN:  Are you having pain?  Yes: NPRS scale: 4/10 Worst: 10/10 Pain location: neck, R upper trap, R mid back Pain  description: achy , dull  Aggravating factors: movement, work at computer Relieving factors: heat, medication  PRECAUTIONS: None  RED FLAGS: None    WEIGHT BEARING RESTRICTIONS: No  FALLS:  Has patient fallen in last 6 months? No  LIVING ENVIRONMENT: Lives with: lives with their family Lives in: House/apartment  OCCUPATION: works for [employer]  PLOF: Independent  PATIENT GOALS: decrease neck pain, improve posture  NEXT MD VISIT: 12/29/2022  OBJECTIVE:  Note: Objective measures were completed at Evaluation unless otherwise noted.  DIAGNOSTIC FINDINGS:  See imaging  PATIENT SURVEYS:  FOTO: 52% function; 66% predicted  COGNITION: Overall cognitive status: Within functional limits for tasks assessed  SENSATION: Light touch: Impaired - L UE  POSTURE: rounded shoulders and forward head  PALPATION: TTP to bilateral upper traps, cervical paraspinals   CERVICAL ROM:   Active ROM A/PROM (deg) eval  Flexion   Extension   Right lateral flexion   Left lateral flexion   Right rotation 50 p!  Left rotation 55   (Blank rows = not tested)  UPPER EXTREMITY ROM:  Active ROM Right eval Left eval  Shoulder flexion    Shoulder extension    Shoulder abduction    Shoulder adduction    Shoulder extension    Shoulder internal rotation    Shoulder external rotation    Elbow flexion    Elbow extension    Wrist flexion    Wrist extension    Wrist ulnar deviation    Wrist radial deviation    Wrist pronation    Wrist supination     (Blank rows = not tested)  UPPER EXTREMITY MMT:  MMT Right eval Left eval  Shoulder flexion    Shoulder extension    Shoulder abduction    Shoulder adduction    Shoulder extension    Shoulder internal rotation    Shoulder external rotation    Middle trapezius    Lower trapezius    Elbow flexion    Elbow extension    Wrist flexion    Wrist extension    Wrist ulnar deviation    Wrist radial deviation    Wrist pronation    Wrist  supination    Grip strength     (Blank rows = not tested)  FUNCTIONAL TESTS:  Deep Cervical Flexor Endurance: 5 sec  TREATMENT: [facility] Adult PT Treatment:                                                DATE: 01/18/23 Therapeutic Exercise: UBE lvl 1.0 x 3 min fwd/bwd while taking subjective Row 2x10 13# Seated bilateral ER 2x10 GTB Seated horizontal abd 2x15 GTB Serratus wall slide YTB 2x10 Standing chin tuck x 10 against wall Seated low/high 2x10 25# Seated lat pulldown 2x10 35#   [facility] Adult PT Treatment:                                                DATE: 01/09/23 Therapeutic Exercise: UBE lvl 1.0 x 3 min fwd while taking subjective Row 2x10 13# Seated bilateral ER 2x10 RTB Supine horizontal abd 2x10 GTB Supine dow flexion x 15 Supine chin tuck 2x10 Manual Therapy: Cervical distraction Positional upper trap stretch bilateral  Levator stretch bilateral  STW to cervical paraspinals and upper trap  [facility] Adult PT Treatment:                                                DATE: 01/02/23 Therapeutic Exercise: Seated chin tuck Seated scap retract x 8 c/o  right mid/low back pain Seated upper trap stretch, scalenes  Seated levator stretch  Supine chin tuck over towel roll x 10 Supine horizontal abdct red band x 10 Supine shoulder ER red band x 10  Updated HEP Manual Therapy: Cervical distraction Positional upper trap stretch bilateral  Levator stretch bilateral  STW to cervical paraspinals   [facility] Adult PT Treatment:                                                DATE: 12/26/2022 Therapeutic Exercise: Supine chin tuck x 5 - 5" hold Seated upper trap stretch x 30" each Seated scapular retraction x 5 Manual Therapy: Positional release to bilateral  Suboccipital release  PATIENT EDUCATION:  Education details: continue HEP Person educated: Patient Education method: Explanation, Demonstration, and Handouts Education comprehension: verbalized understanding and returned  demonstration  HOME EXERCISE PROGRAM: Access Code: [email or web address] URL: [email or web address] Date: 12/26/2022 Prepared by: Edwinna Areola  Exercises - Supine Chin Tuck  - 1 x daily - 7 x weekly - 2 sets - 10 reps - 5 sec hold - Seated Upper Trapezius Stretch  - 1 x daily - 7 x weekly - 2-3 reps - 30 sec hold - Seated Scapular Retraction  - 1 x daily - 7 x weekly - 2 sets - 10 reps - 3 sec hold - Supine Shoulder Horizontal Abduction with Resistance  - 1 x daily - 7 x weekly - 2 sets - 10 reps - Supine shoulder external rotation- KNEES BENT  - 1 x daily - 7 x weekly - 2 sets - 10 reps  ASSESSMENT:  CLINICAL IMPRESSION: Pt was able to complete all prescribed exercises with no adverse effect. Therapy focused on improving DNF and periscapular strength for decreasing neck pain and discomfort. Manual withheld due to stomach pain in supine. HEP updated for continued strengthening. Will continue per POC.   EVAL: Patient is a 47 y.o. F who was seen today for physical therapy evaluation and treatment for chronic neck pain. Physical findings are consistent with MD impression as pt demonstrates decrease in cervical ROM and DNF strength and endurance. FOTO score shows decrease in subjective functional ability below PLOF. Pt would benefit from skilled PT services working on improving strength and decreasing soft tissue pain.    OBJECTIVE IMPAIRMENTS: decreased activity tolerance, decreased endurance, decreased mobility, difficulty walking, decreased ROM, decreased strength, impaired UE functional use, postural dysfunction, and pain  ACTIVITY LIMITATIONS: carrying, lifting, bending, sitting, standing, dressing, reach over head, and hygiene/grooming  PARTICIPATION LIMITATIONS: meal prep, cleaning, driving, shopping, community activity, occupation, and yard work  PERSONAL FACTORS: Time since onset of injury/illness/exacerbation are also affecting patient's functional outcome.   REHAB POTENTIAL:  Excellent  CLINICAL DECISION MAKING: Stable/uncomplicated  EVALUATION COMPLEXITY: Low   GOALS: Goals reviewed with patient? No  SHORT TERM GOALS: Target date: 01/16/2023   Pt will be compliant and knowledgeable with initial HEP for improved comfort and carryover Baseline: initial HEP given  Goal status: INITIAL  2.  Pt will self report neck and upper trap pain no greater than 6/10 for improved comfort and functional ability Baseline: 10/10 at worst Goal status: INITIAL   LONG TERM GOALS: Target date: 02/20/2023   Pt will improve FOTO function score to no less than 66% as proxy for functional improvement Baseline: 52% function Goal status: INITIAL   2.  Pt  will self report neck upper trap pain no greater than 1-2/10 for improved comfort and functional ability Baseline: 10/10 at worst Goal status: INITIAL   3.  Pt will improve deep cervical flexor endurance to no less than 20 seconds for improved postural endurance and decrease neck pain Baseline: 5 seconds Goal status: INITIAL  4.  Pt will improve bilateral cervical rotation to no less than 70 degrees for improved comfort with driving and work activities  Baseline: see ROM chart Goal status: INITIAL   PLAN:  PT FREQUENCY: 1-2x/week  PT DURATION: 8 weeks  PLANNED INTERVENTIONS: Therapeutic exercises, Therapeutic activity, Neuromuscular re-education, Balance training, Gait training, Patient/Family education, Self Care, Joint mobilization, Dry Needling, Electrical stimulation, Cryotherapy, Moist heat, Vasopneumatic device, Manual therapy, and Re-evaluation  PLAN FOR NEXT SESSION: assess HEP response, DNF and periscapular strength, TPDN to upper traps   Eloy End PT  01/18/23 2:46 PM

## 2023-01-23 ENCOUNTER — Ambulatory Visit: Payer: No Typology Code available for payment source | Attending: Physical Medicine and Rehabilitation

## 2023-01-23 DIAGNOSIS — M6281 Muscle weakness (generalized): Secondary | ICD-10-CM | POA: Diagnosis present

## 2023-01-23 DIAGNOSIS — R293 Abnormal posture: Secondary | ICD-10-CM | POA: Insufficient documentation

## 2023-01-23 DIAGNOSIS — M542 Cervicalgia: Secondary | ICD-10-CM | POA: Diagnosis present

## 2023-01-23 NOTE — Therapy (Signed)
OUTPATIENT PHYSICAL THERAPY TREATMENT   Patient Name: Jennifer Roth MRN: [id number] DOB:10/18/1975, 47 y.o., female Today's Date: 01/24/2023  END OF SESSION:  PT End of Session - 01/23/23 1612     Visit Number 5    Number of Visits 17    Date for PT Re-Evaluation 02/20/23    Authorization Type [employer] MCD    PT Start Time 1615    PT Stop Time 1655    PT Time Calculation (min) 40 min                 Past Medical History:  Diagnosis Date   Medical history non-contributory    No pertinent past medical history    Past Surgical History:  Procedure Laterality Date   TUBAL LIGATION  2003   There are no problems to display for this patient.   PCP: Verlon Au, MD  REFERRING PROVIDER: Horton Chin, MD  REFERRING DIAG: [phone] (ICD-10-CM) - Muscle spasm   THERAPY DIAG:  Cervicalgia  Muscle weakness (generalized)  Rationale for Evaluation and Treatment: Rehabilitation  ONSET DATE: Chronic - 11/27/22 recent exacerbation   SUBJECTIVE:                                                                                                                                                                                                         SUBJECTIVE STATEMENT: Pt presents to PT with reports of increased neck pain after long car ride. Has been compliant with HEP.    EVAL: Pt presents to PT with reports of chronic neck pain with recent exacerbation on 11/27/22 with tightness in SCM and upper trap. Denies bowel/bladder changes or saddle anesthesia, does have N/T in L UE down to hand. Feels limited in work and other daily activities secondary to pain. Continues to have HA symptoms into ram horn pattern.   Hand dominance: Right  PERTINENT HISTORY:  See PMH  PAIN:  Are you having pain?  Yes: NPRS scale: 4/10 Worst: 10/10 Pain location: neck, R upper trap, R mid back Pain description: achy , dull  Aggravating factors: movement, work at computer Relieving  factors: heat, medication  PRECAUTIONS: None  RED FLAGS: None    WEIGHT BEARING RESTRICTIONS: No  FALLS:  Has patient fallen in last 6 months? No  LIVING ENVIRONMENT: Lives with: lives with their family Lives in: House/apartment  OCCUPATION: works for [employer]  PLOF: Independent  PATIENT GOALS: decrease neck pain, improve posture  NEXT MD VISIT: 12/29/2022  OBJECTIVE:  Note: Objective measures were completed at Evaluation unless otherwise noted.  DIAGNOSTIC FINDINGS:  See imaging  PATIENT SURVEYS:  FOTO: 52% function; 66% predicted  COGNITION: Overall cognitive status: Within functional limits for tasks assessed  SENSATION: Light touch: Impaired - L UE  POSTURE: rounded shoulders and forward head  PALPATION: TTP to bilateral upper traps, cervical paraspinals   CERVICAL ROM:   Active ROM A/PROM (deg) eval  Flexion   Extension   Right lateral flexion   Left lateral flexion   Right rotation 50 p!  Left rotation 55   (Blank rows = not tested)  UPPER EXTREMITY ROM:  Active ROM Right eval Left eval  Shoulder flexion    Shoulder extension    Shoulder abduction    Shoulder adduction    Shoulder extension    Shoulder internal rotation    Shoulder external rotation    Elbow flexion    Elbow extension    Wrist flexion    Wrist extension    Wrist ulnar deviation    Wrist radial deviation    Wrist pronation    Wrist supination     (Blank rows = not tested)  UPPER EXTREMITY MMT:  MMT Right eval Left eval  Shoulder flexion    Shoulder extension    Shoulder abduction    Shoulder adduction    Shoulder extension    Shoulder internal rotation    Shoulder external rotation    Middle trapezius    Lower trapezius    Elbow flexion    Elbow extension    Wrist flexion    Wrist extension    Wrist ulnar deviation    Wrist radial deviation    Wrist pronation    Wrist supination    Grip strength     (Blank rows = not tested)  FUNCTIONAL TESTS:   Deep Cervical Flexor Endurance: 5 sec  TREATMENT: [facility] Adult PT Treatment:                                                DATE: 01/23/23 Therapeutic Exercise: UBE lvl 1.0 x 3 min fwd/bwd while taking subjective Row 3x10 13# Seated bilateral ER 2x10 GTB Seated horizontal abd 2x15 GTB Serratus wall slide YTB 2x10 Standing chin tuck x 10 against wall Seated low/high 2x10 25# Seated lat pulldown 2x10 35# Seated SNAG into ext x 10 - 5" hold  [facility] Adult PT Treatment:                                                DATE: 01/18/23 Therapeutic Exercise: UBE lvl 1.0 x 3 min fwd/bwd while taking subjective Row 2x10 13# Seated bilateral ER 2x10 GTB Seated horizontal abd 2x15 GTB Serratus wall slide YTB 2x10 Standing chin tuck x 10 against wall Seated low/high 2x10 25# Seated lat pulldown 2x10 35#  [facility] Adult PT Treatment:                                                DATE: 01/09/23 Therapeutic Exercise: UBE lvl 1.0 x 3 min fwd while taking subjective Row 2x10 13# Seated bilateral ER 2x10 RTB Supine horizontal abd 2x10 GTB  Supine dow flexion x 15 Supine chin tuck 2x10 Manual Therapy: Cervical distraction Positional upper trap stretch bilateral  Levator stretch bilateral  STW to cervical paraspinals and upper trap  [facility] Adult PT Treatment:                                                DATE: 01/02/23 Therapeutic Exercise: Seated chin tuck Seated scap retract x 8 c/o right mid/low back pain Seated upper trap stretch, scalenes  Seated levator stretch  Supine chin tuck over towel roll x 10 Supine horizontal abdct red band x 10 Supine shoulder ER red band x 10  Updated HEP Manual Therapy: Cervical distraction Positional upper trap stretch bilateral  Levator stretch bilateral  STW to cervical paraspinals   [facility] Adult PT Treatment:                                                DATE: 12/26/2022 Therapeutic Exercise: Supine chin tuck x 5 - 5" hold Seated upper trap stretch x  30" each Seated scapular retraction x 5 Manual Therapy: Positional release to bilateral  Suboccipital release  PATIENT EDUCATION:  Education details: continue HEP Person educated: Patient Education method: Explanation, Demonstration, and Handouts Education comprehension: verbalized understanding and returned demonstration  HOME EXERCISE PROGRAM: Access Code: [email or web address] URL: [email or web address] Date: 12/26/2022 Prepared by: Edwinna Areola  Exercises - Supine Chin Tuck  - 1 x daily - 7 x weekly - 2 sets - 10 reps - 5 sec hold - Seated Upper Trapezius Stretch  - 1 x daily - 7 x weekly - 2-3 reps - 30 sec hold - Seated Scapular Retraction  - 1 x daily - 7 x weekly - 2 sets - 10 reps - 3 sec hold - Supine Shoulder Horizontal Abduction with Resistance  - 1 x daily - 7 x weekly - 2 sets - 10 reps - Supine shoulder external rotation- KNEES BENT  - 1 x daily - 7 x weekly - 2 sets - 10 reps  ASSESSMENT:  CLINICAL IMPRESSION: Pt was able to complete all prescribed exercises with no adverse effect. Therapy focused on improving DNF and periscapular strength for decreasing neck pain and discomfort. HEP updated for continued strengthening. Will continue per POC.   EVAL: Patient is a 47 y.o. F who was seen today for physical therapy evaluation and treatment for chronic neck pain. Physical findings are consistent with MD impression as pt demonstrates decrease in cervical ROM and DNF strength and endurance. FOTO score shows decrease in subjective functional ability below PLOF. Pt would benefit from skilled PT services working on improving strength and decreasing soft tissue pain.    OBJECTIVE IMPAIRMENTS: decreased activity tolerance, decreased endurance, decreased mobility, difficulty walking, decreased ROM, decreased strength, impaired UE functional use, postural dysfunction, and pain  ACTIVITY LIMITATIONS: carrying, lifting, bending, sitting, standing, dressing, reach over head, and  hygiene/grooming  PARTICIPATION LIMITATIONS: meal prep, cleaning, driving, shopping, community activity, occupation, and yard work  PERSONAL FACTORS: Time since onset of injury/illness/exacerbation are also affecting patient's functional outcome.   REHAB POTENTIAL: Excellent  CLINICAL DECISION MAKING: Stable/uncomplicated  EVALUATION COMPLEXITY: Low   GOALS: Goals reviewed with patient? No  SHORT TERM GOALS: Target date: 01/16/2023   Pt  will be compliant and knowledgeable with initial HEP for improved comfort and carryover Baseline: initial HEP given  Goal status: INITIAL  2.  Pt will self report neck and upper trap pain no greater than 6/10 for improved comfort and functional ability Baseline: 10/10 at worst Goal status: INITIAL   LONG TERM GOALS: Target date: 02/20/2023   Pt will improve FOTO function score to no less than 66% as proxy for functional improvement Baseline: 52% function Goal status: INITIAL   2.  Pt will self report neck upper trap pain no greater than 1-2/10 for improved comfort and functional ability Baseline: 10/10 at worst Goal status: INITIAL   3.  Pt will improve deep cervical flexor endurance to no less than 20 seconds for improved postural endurance and decrease neck pain Baseline: 5 seconds Goal status: INITIAL  4.  Pt will improve bilateral cervical rotation to no less than 70 degrees for improved comfort with driving and work activities  Baseline: see ROM chart Goal status: INITIAL   PLAN:  PT FREQUENCY: 1-2x/week  PT DURATION: 8 weeks  PLANNED INTERVENTIONS: Therapeutic exercises, Therapeutic activity, Neuromuscular re-education, Balance training, Gait training, Patient/Family education, Self Care, Joint mobilization, Dry Needling, Electrical stimulation, Cryotherapy, Moist heat, Vasopneumatic device, Manual therapy, and Re-evaluation  PLAN FOR NEXT SESSION: assess HEP response, DNF and periscapular strength, TPDN to upper  traps   Eloy End PT  01/24/23 7:53 AM

## 2023-01-30 ENCOUNTER — Ambulatory Visit: Payer: No Typology Code available for payment source

## 2023-01-30 DIAGNOSIS — M542 Cervicalgia: Secondary | ICD-10-CM | POA: Diagnosis not present

## 2023-01-30 DIAGNOSIS — M6281 Muscle weakness (generalized): Secondary | ICD-10-CM

## 2023-01-30 DIAGNOSIS — R293 Abnormal posture: Secondary | ICD-10-CM

## 2023-01-30 NOTE — Therapy (Addendum)
 OUTPATIENT PHYSICAL THERAPY TREATMENT NOTE/DISCHARGE  PHYSICAL THERAPY DISCHARGE SUMMARY  Visits from Start of Care: 6  Current functional level related to goals / functional outcomes: See goals/objective   Remaining deficits: Unable to assess   Education / Equipment: HEP   Patient agrees to discharge. Patient goals were unable to assess. Patient is being discharged due to not returning since the last visit.     Patient Name: Jennifer Roth MRN: [id number] DOB:26-Jan-1976, 47 y.o., female Today's Date: 01/31/2023  END OF SESSION:  PT End of Session - 01/30/23 1611     Visit Number 6    Number of Visits 17    Date for PT Re-Evaluation 02/20/23    Authorization Type [employer] MCD    PT Start Time 1615    PT Stop Time 1655    PT Time Calculation (min) 40 min                  Past Medical History:  Diagnosis Date   Medical history non-contributory    No pertinent past medical history    Past Surgical History:  Procedure Laterality Date   TUBAL LIGATION  2003   There are no problems to display for this patient.   PCP: Jolee Madelin Patch, MD  REFERRING PROVIDER: Lorilee Sven SQUIBB, MD  REFERRING DIAG: [phone] (ICD-10-CM) - Muscle spasm   THERAPY DIAG:  Cervicalgia  Muscle weakness (generalized)  Abnormal posture  Rationale for Evaluation and Treatment: Rehabilitation  ONSET DATE: Chronic - 11/27/22 recent exacerbation   SUBJECTIVE:                                                                                                                                                                                                         SUBJECTIVE STATEMENT: Pt presents to PT with reports of increased neck pain. Has been compliant with HEP.    EVAL: Pt presents to PT with reports of chronic neck pain with recent exacerbation on 11/27/22 with tightness in SCM and upper trap. Denies bowel/bladder changes or saddle anesthesia, does have N/T in L UE down to hand.  Feels limited in work and other daily activities secondary to pain. Continues to have HA symptoms into ram horn pattern.   Hand dominance: Right  PERTINENT HISTORY:  See PMH  PAIN:  Are you having pain?  Yes: NPRS scale: 5/10 Worst: 10/10 Pain location: neck, R upper trap, R mid back Pain description: achy , dull  Aggravating factors: movement, work at computer Relieving factors: heat, medication  PRECAUTIONS: None  RED FLAGS: None  WEIGHT BEARING RESTRICTIONS: No  FALLS:  Has patient fallen in last 6 months? No  LIVING ENVIRONMENT: Lives with: lives with their family Lives in: House/apartment  OCCUPATION: works for [employer]  PLOF: Independent  PATIENT GOALS: decrease neck pain, improve posture  NEXT MD VISIT: 12/29/2022  OBJECTIVE:  Note: Objective measures were completed at Evaluation unless otherwise noted.  DIAGNOSTIC FINDINGS:  See imaging  PATIENT SURVEYS:  FOTO: 52% function; 66% predicted  COGNITION: Overall cognitive status: Within functional limits for tasks assessed  SENSATION: Light touch: Impaired - L UE  POSTURE: rounded shoulders and forward head  PALPATION: TTP to bilateral upper traps, cervical paraspinals   CERVICAL ROM:   Active ROM A/PROM (deg) eval AROM  Flexion    Extension    Right lateral flexion    Left lateral flexion    Right rotation 50 p! 50 p!  Left rotation 55 50   (Blank rows = not tested)  UPPER EXTREMITY ROM:  Active ROM Right eval Left eval  Shoulder flexion    Shoulder extension    Shoulder abduction    Shoulder adduction    Shoulder extension    Shoulder internal rotation    Shoulder external rotation    Elbow flexion    Elbow extension    Wrist flexion    Wrist extension    Wrist ulnar deviation    Wrist radial deviation    Wrist pronation    Wrist supination     (Blank rows = not tested)  UPPER EXTREMITY MMT:  MMT Right eval Left eval  Shoulder flexion    Shoulder extension     Shoulder abduction    Shoulder adduction    Shoulder extension    Shoulder internal rotation    Shoulder external rotation    Middle trapezius    Lower trapezius    Elbow flexion    Elbow extension    Wrist flexion    Wrist extension    Wrist ulnar deviation    Wrist radial deviation    Wrist pronation    Wrist supination    Grip strength     (Blank rows = not tested)  FUNCTIONAL TESTS:  Deep Cervical Flexor Endurance: 5 sec  01/30/23: 15 seconds  TREATMENT: [facility] Adult PT Treatment:                                                DATE: 01/30/23 Therapeutic Exercise: UBE lvl 1.0 x 3 min fwd while taking subjective Row 3x10 blue band Seated bilateral ER 2x10 GTB Seated horizontal abd 2x15 GTB Serratus wall slide YTB 2x10 Seated low/high 2x10 25# Seated lat pulldown 2x10 35# Seated SNAG into ext x 10 - 5 hold Seated SNAG into rot x 5 - 5 hold Therapeutic Activity: Assessment of tests/mesaures, goals, and outcomes  [facility] Adult PT Treatment:                                                DATE: 01/23/23 Therapeutic Exercise: UBE lvl 1.0 x 3 min fwd/bwd while taking subjective Row 3x10 13# Seated bilateral ER 2x10 GTB Seated horizontal abd 2x15 GTB Serratus wall slide YTB 2x10 Standing chin tuck x 10 against wall Seated low/high 2x10  25# Seated lat pulldown 2x10 35# Seated SNAG into ext x 10 - 5 hold  [facility] Adult PT Treatment:                                                DATE: 01/18/23 Therapeutic Exercise: UBE lvl 1.0 x 3 min fwd/bwd while taking subjective Row 2x10 13# Seated bilateral ER 2x10 GTB Seated horizontal abd 2x15 GTB Serratus wall slide YTB 2x10 Standing chin tuck x 10 against wall Seated low/high 2x10 25# Seated lat pulldown 2x10 35#  [facility] Adult PT Treatment:                                                DATE: 01/09/23 Therapeutic Exercise: UBE lvl 1.0 x 3 min fwd while taking subjective Row 2x10 13# Seated bilateral ER 2x10 RTB Supine  horizontal abd 2x10 GTB Supine dow flexion x 15 Supine chin tuck 2x10 Manual Therapy: Cervical distraction Positional upper trap stretch bilateral  Levator stretch bilateral  STW to cervical paraspinals and upper trap  PATIENT EDUCATION:  Education details: continue HEP Person educated: Patient Education method: Explanation, Facilities manager, and Handouts Education comprehension: verbalized understanding and returned demonstration  HOME EXERCISE PROGRAM: Access Code: [email or web address] URL: [email or web address] Date: 01/30/2023 Prepared by: Alm Kingdom  Exercises - Supine Chin Tuck  - 1 x daily - 7 x weekly - 2 sets - 10 reps - 5 sec hold - cervical extension snag with towel  - 1 x daily - 7 x weekly - 2 sets - 10 reps - 5 sec hold - Seated Assisted Cervical Rotation with Towel  - 1 x daily - 7 x weekly - 2 sets - 5 reps - 5 sec hold - Seated Upper Trapezius Stretch  - 1 x daily - 7 x weekly - 2-3 reps - 30 sec hold - Shoulder External Rotation and Scapular Retraction with Resistance  - 1 x daily - 7 x weekly - 2-3 sets - 15 reps - green band hold - Standing Shoulder Horizontal Abduction with Resistance  - 1 x daily - 7 x weekly - 2-3 sets - 10 reps - green band hold - Serratus Activation at Wall  - 1 x daily - 7 x weekly - 2 sets - 10 reps - yellow band hold - Standing Shoulder Row with Anchored Resistance  - 1 x daily - 7 x weekly - 3 sets - 10 reps - blue band hold  ASSESSMENT:  CLINICAL IMPRESSION: Pt was able to complete all prescribed exercises with no adverse effect. Pt shows improvement in subjective functional ability assessed via FOTO. Therapy today focused on improving DNF and periscapular strength for decreasing neck pain and discomfort. HEP updated and she will wait til seeing referring MD until scheduling more visits.   EVAL: Patient is a 47 y.o. F who was seen today for physical therapy evaluation and treatment for chronic neck pain. Physical findings are  consistent with MD impression as pt demonstrates decrease in cervical ROM and DNF strength and endurance. FOTO score shows decrease in subjective functional ability below PLOF. Pt would benefit from skilled PT services working on improving strength and decreasing soft tissue pain.    OBJECTIVE IMPAIRMENTS: decreased activity tolerance, decreased endurance,  decreased mobility, difficulty walking, decreased ROM, decreased strength, impaired UE functional use, postural dysfunction, and pain  ACTIVITY LIMITATIONS: carrying, lifting, bending, sitting, standing, dressing, reach over head, and hygiene/grooming  PARTICIPATION LIMITATIONS: meal prep, cleaning, driving, shopping, community activity, occupation, and yard work  PERSONAL FACTORS: Time since onset of injury/illness/exacerbation are also affecting patient's functional outcome.   REHAB POTENTIAL: Excellent  CLINICAL DECISION MAKING: Stable/uncomplicated  EVALUATION COMPLEXITY: Low   GOALS: Goals reviewed with patient? No  SHORT TERM GOALS: Target date: 01/16/2023   Pt will be compliant and knowledgeable with initial HEP for improved comfort and carryover Baseline: initial HEP given  Goal status: MET  2.  Pt will self report neck and upper trap pain no greater than 6/10 for improved comfort and functional ability Baseline: 10/10 at worst Goal status: MET   LONG TERM GOALS: Target date: 02/20/2023   Pt will improve FOTO function score to no less than 66% as proxy for functional improvement Baseline: 52% function 01/30/2023: 64% function Goal status: IN PROGRESS   2.  Pt will self report neck upper trap pain no greater than 1-2/10 for improved comfort and functional ability Baseline: 10/10 at worst Goal status: IN PROGRESS   3.  Pt will improve deep cervical flexor endurance to no less than 20 seconds for improved postural endurance and decrease neck pain Baseline: 5 seconds 01/30/23: 15 seconds Goal status: IN PROGRESS  4.   Pt will improve bilateral cervical rotation to no less than 70 degrees for improved comfort with driving and work activities  Baseline: see ROM chart Goal status: IN PROGRESS   PLAN:  PT FREQUENCY: 1-2x/week  PT DURATION: 8 weeks  PLANNED INTERVENTIONS: Therapeutic exercises, Therapeutic activity, Neuromuscular re-education, Balance training, Gait training, Patient/Family education, Self Care, Joint mobilization, Dry Needling, Electrical stimulation, Cryotherapy, Moist heat, Vasopneumatic device, Manual therapy, and Re-evaluation  PLAN FOR NEXT SESSION: assess HEP response, DNF and periscapular strength, TPDN to upper traps   Alm JAYSON Kingdom PT  01/31/23 8:07 AM

## 2023-02-09 ENCOUNTER — Encounter
Payer: No Typology Code available for payment source | Attending: Physical Medicine and Rehabilitation | Admitting: Physical Medicine and Rehabilitation

## 2023-02-09 VITALS — BP 107/73 | HR 92 | Ht 65.0 in | Wt 162.0 lb

## 2023-02-09 DIAGNOSIS — M7918 Myalgia, other site: Secondary | ICD-10-CM | POA: Diagnosis present

## 2023-02-09 MED ORDER — LIDOCAINE HCL 1 % IJ SOLN
5.0000 mL | Freq: Once | INTRAMUSCULAR | Status: AC
Start: 2023-02-09 — End: 2023-02-09
  Administered 2023-02-09: 5 mL via INTRADERMAL

## 2023-02-09 MED ORDER — SODIUM CHLORIDE (PF) 0.9 % IJ SOLN
2.0000 mL | Freq: Every day | INTRAMUSCULAR | Status: AC
  Administered 2023-02-09: 2 mL via INTRAVENOUS

## 2023-02-09 NOTE — Addendum Note (Signed)
Addended by: Silas Sacramento T on: 02/09/2023 01:19 PM   Modules accepted: Orders

## 2023-02-09 NOTE — Progress Notes (Signed)
